# Patient Record
Sex: Female | Born: 1990 | Race: White | Hispanic: No | Marital: Single | Smoking: Current every day smoker
Health system: Southern US, Community
[De-identification: ages and names within clinical notes are randomized; demographics above are authoritative.]

## PROBLEM LIST (undated history)

## (undated) DIAGNOSIS — G43909 Migraine, unspecified, not intractable, without status migrainosus: Secondary | ICD-10-CM

## (undated) DIAGNOSIS — R519 Headache, unspecified: Secondary | ICD-10-CM

## (undated) DIAGNOSIS — F419 Anxiety disorder, unspecified: Secondary | ICD-10-CM

## (undated) DIAGNOSIS — F909 Attention-deficit hyperactivity disorder, unspecified type: Secondary | ICD-10-CM

## (undated) DIAGNOSIS — F329 Major depressive disorder, single episode, unspecified: Secondary | ICD-10-CM

## (undated) DIAGNOSIS — T7840XA Allergy, unspecified, initial encounter: Secondary | ICD-10-CM

## (undated) DIAGNOSIS — F32A Depression, unspecified: Secondary | ICD-10-CM

## (undated) DIAGNOSIS — M419 Scoliosis, unspecified: Secondary | ICD-10-CM

## (undated) DIAGNOSIS — R51 Headache: Secondary | ICD-10-CM

## (undated) HISTORY — DX: Anxiety disorder, unspecified: F41.9

## (undated) HISTORY — DX: Headache, unspecified: R51.9

## (undated) HISTORY — DX: Scoliosis, unspecified: M41.9

## (undated) HISTORY — DX: Depression, unspecified: F32.A

## (undated) HISTORY — DX: Allergy, unspecified, initial encounter: T78.40XA

## (undated) HISTORY — DX: Migraine, unspecified, not intractable, without status migrainosus: G43.909

## (undated) HISTORY — DX: Headache: R51

## (undated) HISTORY — DX: Attention-deficit hyperactivity disorder, unspecified type: F90.9

## (undated) HISTORY — DX: Major depressive disorder, single episode, unspecified: F32.9

---
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
## 2011-02-03 ENCOUNTER — Other Ambulatory Visit: Payer: Self-pay | Admitting: Internal Medicine

## 2011-02-03 DIAGNOSIS — R519 Headache, unspecified: Secondary | ICD-10-CM

## 2011-02-17 ENCOUNTER — Ambulatory Visit
Admission: RE | Admit: 2011-02-17 | Discharge: 2011-02-17 | Disposition: A | Discharge status: Home / Self Care | Payer: BC Managed Care – PPO | Source: Ambulatory Visit | Attending: Internal Medicine | Admitting: Internal Medicine

## 2011-02-17 DIAGNOSIS — R519 Headache, unspecified: Secondary | ICD-10-CM

## 2011-03-07 ENCOUNTER — Ambulatory Visit (INDEPENDENT_AMBULATORY_CARE_PROVIDER_SITE_OTHER): Payer: BC Managed Care – PPO

## 2011-03-07 DIAGNOSIS — H9209 Otalgia, unspecified ear: Secondary | ICD-10-CM

## 2011-03-07 DIAGNOSIS — J029 Acute pharyngitis, unspecified: Secondary | ICD-10-CM

## 2011-03-07 DIAGNOSIS — H6500 Acute serous otitis media, unspecified ear: Secondary | ICD-10-CM

## 2011-08-31 ENCOUNTER — Ambulatory Visit (INDEPENDENT_AMBULATORY_CARE_PROVIDER_SITE_OTHER): Payer: BC Managed Care – PPO | Admitting: Internal Medicine

## 2011-08-31 ENCOUNTER — Encounter: Payer: Self-pay | Admitting: Internal Medicine

## 2011-08-31 ENCOUNTER — Ambulatory Visit: Payer: BC Managed Care – PPO | Admitting: Internal Medicine

## 2011-08-31 VITALS — BP 125/81 | HR 100 | Temp 97.2°F | Resp 16 | Ht 64.0 in | Wt 119.0 lb

## 2011-08-31 DIAGNOSIS — F419 Anxiety disorder, unspecified: Secondary | ICD-10-CM | POA: Insufficient documentation

## 2011-08-31 DIAGNOSIS — F329 Major depressive disorder, single episode, unspecified: Secondary | ICD-10-CM

## 2011-08-31 DIAGNOSIS — F411 Generalized anxiety disorder: Secondary | ICD-10-CM

## 2011-08-31 DIAGNOSIS — F32A Depression, unspecified: Secondary | ICD-10-CM | POA: Insufficient documentation

## 2011-08-31 DIAGNOSIS — F988 Other specified behavioral and emotional disorders with onset usually occurring in childhood and adolescence: Secondary | ICD-10-CM | POA: Insufficient documentation

## 2011-08-31 DIAGNOSIS — F39 Unspecified mood [affective] disorder: Secondary | ICD-10-CM | POA: Insufficient documentation

## 2011-08-31 MED ORDER — AMPHETAMINE-DEXTROAMPHETAMINE 10 MG PO TABS: 10.0000 mg | ORAL_TABLET | Freq: Two times a day (BID) | ORAL | Status: DC

## 2011-08-31 NOTE — Patient Instructions (Addendum)
Risperidone= half a tablet at bedtime for 3 days then discontinue Lamictal= Lamotrigine--100 mg daily for 7 days, then 50 mg daily for 7 days, then 50 mg every other day for 4 doses, then discontinue Wait for one month before starting to discontinue bupropion/he may take 150 mg every other day for 3 doses and then discontinue

## 2011-08-31 NOTE — Progress Notes (Signed)
  Subjective:    Patient ID: Ariel Peters, female    DOB: 02/21/1991, 21 y.o.   MRN: [id number]  HPI Patient Active Problem List  Diagnosis  . ADD (attention deficit disorder)  . Depression  . Anxiety  . Mood disorder  At her last office visit with me she was referred to Dr. Len Blalock for evaluation because of rapid mood changes. She had been on medication for depression and anxiety for a few years. She also was on Adderall for attention deficit disorder. Current outpatient prescriptions :ALPRAZolam (XANAX) 0.5 MG tablet, Take 0.5 mg by mouth at bedtime as needed   buPROPion (ZYBAN) 150 MG 12 hr tablet, Take 150 mg by mouth 2 (two) times daily   lamoTRIgine (LAMICTAL) 150 MG tablet, Take 150 mg by mouth daily  (RISPERDAL) 3 MG tablet, Take 3 mg by mouth 2 (two) times daily This last medication was added by Anne Fu She feels like her is much more stable at this point and that the diagnosis of bipolar disorder was made in error and she would like to discontinue medications She just ended her relationship because of incompatibility She continues to have significant social anxiety and has insomnia Due to worrying 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 and would like to restart Adderall as she has problems with distractibility, efficiency was studying, and procrastination as well as general inattention     Review of Systems No vision changes/no weight loss or night sweats/palpitations only during great anxiety/no chest pain No gait abnormalities/no headaches/describes occasional tremor/no acid  reflux/no abdominal pain No GU or GYN symptoms/menses regular    Objective:   Physical Exam Vital signs stable Pupils equal round and reactive to light and accommodation/EOMs conjugate ENT clear No thyromegaly or lymphadenopathy Heart regular without murmurs rubs or gallops Abdomen benign Neurological is intact/No tremor Psychiatric-affect is appropriate/mood is good/judgment is intact       Assessment & Plan:  Problem #1 ADD Problem #2 social anxiety disorder Problem #3 history of depression now stable Problem #4 history of diagnosis of bipolar disorder  Patient Instructions  Risperidone= half a tablet at bedtime for 3 days then discontinue Lamictal= Lamotrigine--100 mg daily for 7 days, then 50 mg daily for 7 days, then 50 mg every other day for 4 doses, then discontinue Wait for one month before starting to discontinue bupropion/he may take 150 mg every other day for 3 doses and then discontinue  She will continue the Xanax and has a supply/she is she is advised to have close followup as any symptoms emerge/lab testing should be considered if fatigue continues, dizziness continues, or nausea continues We may have to focus on insomnia this does not resolve with discontinuing her medicines Adderall will be restarted at 10 mg twice a day/3 month supply Recheck in 3 months if not sooner

## 2011-09-12 ENCOUNTER — Telehealth: Payer: Self-pay

## 2011-09-12 NOTE — Telephone Encounter (Signed)
Called [facility] [employer] and completed prior auth over the phone and received approval through 09/11/12. Faxed approval notification to pharm. Notified pt.

## 2011-09-12 NOTE — Telephone Encounter (Signed)
Pt calling to say that dr Merla Riches gave her the rx for her adderall, but [facility]. [facility]. [facility] [address]

## 2011-09-12 NOTE — Telephone Encounter (Signed)
Ariel Peters needs a prior authorization is needed for this rx for adderall

## 2011-09-26 ENCOUNTER — Other Ambulatory Visit: Payer: Self-pay | Admitting: Internal Medicine

## 2011-09-27 ENCOUNTER — Other Ambulatory Visit: Payer: Self-pay | Admitting: *Deleted

## 2011-09-27 MED ORDER — ALPRAZOLAM 0.5 MG PO TABS: 0.5000 mg | ORAL_TABLET | Freq: Three times a day (TID) | ORAL | Status: DC | PRN

## 2011-11-10 ENCOUNTER — Other Ambulatory Visit: Payer: Self-pay | Admitting: Family Medicine

## 2011-11-10 MED ORDER — ALPRAZOLAM 0.5 MG PO TABS: 0.5000 mg | ORAL_TABLET | Freq: Three times a day (TID) | ORAL | Status: DC | PRN

## 2011-11-30 ENCOUNTER — Ambulatory Visit: Payer: BC Managed Care – PPO | Admitting: Internal Medicine

## 2011-12-06 ENCOUNTER — Telehealth: Payer: Self-pay

## 2011-12-06 DIAGNOSIS — F988 Other specified behavioral and emotional disorders with onset usually occurring in childhood and adolescence: Secondary | ICD-10-CM

## 2011-12-06 NOTE — Telephone Encounter (Signed)
Dr Merla Riches, pt was in to see you in June and you wanted to see her back in 3 mos or less. Do you want to RF her Adderall until she can get in to see you?

## 2011-12-06 NOTE — Telephone Encounter (Signed)
Pt mother calling they pt came in to office to see Merla Riches but was not able to stay she is asking for a rx refill on pt adderall until they can get an appt with Merla Riches. [phone]

## 2011-12-06 NOTE — Telephone Encounter (Signed)
I told her to come in as a walk-in at [facility] Tommorrow at noon

## 2011-12-07 NOTE — Telephone Encounter (Signed)
She did not come in to [facility] today Okay to give one-month supply of Adderall and she can see me during the next month at the walk-in clinic or at [facility]/ her choice

## 2011-12-08 MED ORDER — AMPHETAMINE-DEXTROAMPHETAMINE 10 MG PO TABS: 10.0000 mg | ORAL_TABLET | Freq: Two times a day (BID) | ORAL | Status: DC

## 2011-12-08 NOTE — Telephone Encounter (Signed)
Refill done and printed

## 2011-12-08 NOTE — Telephone Encounter (Signed)
Notified mother Rx is ready for p/up

## 2011-12-08 NOTE — Telephone Encounter (Signed)
Can we print/sign pt's Rx for Adderall per Dr Doolittle's OK? Left RF pending in message.

## 2011-12-26 ENCOUNTER — Other Ambulatory Visit: Payer: Self-pay | Admitting: Physician Assistant

## 2012-01-04 ENCOUNTER — Encounter: Payer: Self-pay | Admitting: Internal Medicine

## 2012-01-04 ENCOUNTER — Ambulatory Visit (INDEPENDENT_AMBULATORY_CARE_PROVIDER_SITE_OTHER): Payer: BC Managed Care – PPO | Admitting: Internal Medicine

## 2012-01-04 VITALS — BP 100/64 | HR 99 | Temp 98.1°F | Resp 16 | Ht 64.75 in | Wt 111.2 lb

## 2012-01-04 DIAGNOSIS — F411 Generalized anxiety disorder: Secondary | ICD-10-CM

## 2012-01-04 DIAGNOSIS — F419 Anxiety disorder, unspecified: Secondary | ICD-10-CM

## 2012-01-04 DIAGNOSIS — F988 Other specified behavioral and emotional disorders with onset usually occurring in childhood and adolescence: Secondary | ICD-10-CM

## 2012-01-04 MED ORDER — ALPRAZOLAM 0.5 MG PO TABS: 0.5000 mg | ORAL_TABLET | Freq: Three times a day (TID) | ORAL | Status: DC | PRN

## 2012-01-04 MED ORDER — AMPHETAMINE-DEXTROAMPHETAMINE 20 MG PO TABS: 20.0000 mg | ORAL_TABLET | Freq: Two times a day (BID) | ORAL | Status: DC

## 2012-01-04 NOTE — Progress Notes (Signed)
  Subjective:    Patient ID: Ariel Peters, female    DOB: 1990-11-14, 21 y.o.   MRN: [id number]  HPIf/u  Patient Active Problem List  Diagnosis  . ADD (attention deficit disorder)  . 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 ADD (attention deficit disorder)-incr to 20mg  BID  . Anxiety   Meds ordered this encounter  Medications  . amphetamine-dextroamphetamine (ADDERALL) 20 MG tablet    Sig: Take 1 tablet (20 mg total) by mouth 2 (two) times daily.    Dispense:  60 tablet    Refill:  0  . amphetamine-dextroamphetamine (ADDERALL) 20 MG tablet    Sig: Take 1 tablet (20 mg total) by mouth 2 (two) times daily.    Dispense:  60 tablet    Refill:  0  . amphetamine-dextroamphetamine (ADDERALL) 20 MG tablet    Sig: Take 1 tablet (20 mg total) by mouth 2 (two) times daily.    Dispense:  60 tablet    Refill:  0  . ALPRAZolam (XANAX) 0.5 MG tablet    Sig: Take 1 tablet (0.5 mg total) by mouth 3 (three) times daily as needed for anxiety.    Dispense:  90 tablet    Refill:  1  no flu vacc may call in 3 mos if stable

## 2012-04-03 ENCOUNTER — Ambulatory Visit (INDEPENDENT_AMBULATORY_CARE_PROVIDER_SITE_OTHER): Payer: BC Managed Care – PPO | Admitting: Internal Medicine

## 2012-04-03 ENCOUNTER — Encounter: Payer: Self-pay | Admitting: Internal Medicine

## 2012-04-03 VITALS — BP 106/72 | HR 93 | Temp 98.6°F | Resp 16 | Ht 64.0 in | Wt 111.4 lb

## 2012-04-03 DIAGNOSIS — F988 Other specified behavioral and emotional disorders with onset usually occurring in childhood and adolescence: Secondary | ICD-10-CM

## 2012-04-03 DIAGNOSIS — F411 Generalized anxiety disorder: Secondary | ICD-10-CM

## 2012-04-03 DIAGNOSIS — F419 Anxiety disorder, unspecified: Secondary | ICD-10-CM

## 2012-04-03 MED ORDER — AMPHETAMINE-DEXTROAMPHETAMINE 20 MG PO TABS: 20.0000 mg | ORAL_TABLET | Freq: Two times a day (BID) | ORAL | Status: DC

## 2012-04-03 MED ORDER — ALPRAZOLAM 0.5 MG PO TABS: 0.5000 mg | ORAL_TABLET | Freq: Three times a day (TID) | ORAL | Status: DC | PRN

## 2012-04-03 MED ORDER — NORGESTIMATE-ETH ESTRADIOL 0.25-35 MG-MCG PO TABS: 1.0000 | ORAL_TABLET | Freq: Every day | ORAL | Status: DC

## 2012-04-03 NOTE — Progress Notes (Signed)
  Subjective:    Patient ID: Ariel Peters, female    DOB: 12-Aug-1990, 22 y.o.   MRN: [id number]  HPI followup for ADD and anxiety Doing very well. 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 Has decided not to go back to school at this point but to continue working. Adderall works well with no side effects. Anxiety has been very stable requiring Xanax perhaps once a day when stressed by work.  Denies depression, self injury, mania, uncontrolled mood.  Still in relationship- for one year. Good relationship. Condoms. Would like to start birth control mainly because of bad cramps for 2 days that required her to miss work. No dyspareunia. No vaginal discharge. No history of STDs. Partner recently checked for STDs and was negative. She declines testing today.   New guitar she found at [facility]   Review of Systems No new symptoms No headaches or vision changes No chest pain palpitations No genitourinary symptoms Philemon Kingdom has been since menarche    Objective:   Physical Exam Vital signs stable HEENT clear Heart regular Neurological intact Mood good/affect appropriate/judgment intact       Assessment & Plan:   1. ADD (attention deficit disorder)   2. Anxiety   3.   Dysmenorrhea 4.  Contraception  Meds ordered this encounter  Medications  . amphetamine-dextroamphetamine (ADDERALL) 20 MG tablet    Sig: Take 1 tablet (20 mg total) by mouth 2 (two) times daily.    Dispense:  60 tablet    Refill:  0  . amphetamine-dextroamphetamine (ADDERALL) 20 MG tablet    Sig: Take 1 tablet (20 mg total) by mouth 2 (two) times daily.    Dispense:  60 tablet    Refill:  0        05/04/12  . amphetamine-dextroamphetamine (ADDERALL) 20 MG tablet    Sig: Take 1 tablet (20 mg total) by mouth 2 (two) times daily.    Dispense:  60 tablet    Refill:  0      06/01/12  . ALPRAZolam (XANAX) 0.5 MG tablet    Sig: Take 1 tablet (0.5 mg total) by mouth 3 (three) times daily as needed for  anxiety.    Dispense:  90 tablet    Refill:  1  . norgestimate-ethinyl estradiol (ORTHO-CYCLEN,SPRINTEC,PREVIFEM) 0.25-35 MG-MCG tablet    Sig: Take 1 tablet by mouth daily.    Dispense:  1 Package    Refill:  11  Call with problems with birth control/side effects 2 start on first day next menses  may call in 3 months for Adderall and followup in 6 months

## 2012-05-03 ENCOUNTER — Other Ambulatory Visit: Payer: Self-pay | Admitting: Internal Medicine

## 2012-05-04 NOTE — Telephone Encounter (Signed)
Gave 90 w/ 1 refill at last ov 04/03/12

## 2012-05-07 NOTE — Telephone Encounter (Signed)
Left message on machine for patient to call back.

## 2012-05-28 ENCOUNTER — Telehealth: Payer: Self-pay

## 2012-05-28 DIAGNOSIS — F411 Generalized anxiety disorder: Secondary | ICD-10-CM

## 2012-05-28 NOTE — Telephone Encounter (Signed)
Pharm requests RF of alprazolam 0.5 mg.

## 2012-05-29 MED ORDER — ALPRAZOLAM 0.5 MG PO TABS: 0.5000 mg | ORAL_TABLET | Freq: Three times a day (TID) | ORAL | Status: DC | PRN

## 2012-05-29 NOTE — Telephone Encounter (Signed)
No orders of the defined types were placed in this encounter.   Meds ordered this encounter  Medications  . ALPRAZolam (XANAX) 0.5 MG tablet    Sig: Take 1 tablet (0.5 mg total) by mouth 3 (three) times daily as needed for anxiety.    Dispense:  90 tablet    Refill:  1

## 2012-05-29 NOTE — Telephone Encounter (Signed)
[facility] [address]

## 2012-07-09 ENCOUNTER — Telehealth: Payer: Self-pay

## 2012-07-09 DIAGNOSIS — F988 Other specified behavioral and emotional disorders with onset usually occurring in childhood and adolescence: Secondary | ICD-10-CM

## 2012-07-09 NOTE — Telephone Encounter (Signed)
PT WOULD REFILL REQUEST FOR ADDERALL 20 MG. PT STATES THAT SHE IS CURRENTLY OUT. [phone]

## 2012-07-11 MED ORDER — AMPHETAMINE-DEXTROAMPHETAMINE 20 MG PO TABS: 20.0000 mg | ORAL_TABLET | Freq: Two times a day (BID) | ORAL | Status: DC

## 2012-07-11 NOTE — Telephone Encounter (Signed)
Meds ordered this encounter  Medications  . amphetamine-dextroamphetamine (ADDERALL) 20 MG tablet    Sig: Take 1 tablet (20 mg total) by mouth 2 (two) times daily. 09/09/12    Dispense:  60 tablet    Refill:  0  . amphetamine-dextroamphetamine (ADDERALL) 20 MG tablet    Sig: Take 1 tablet (20 mg total) by mouth 2 (two) times daily. 08/10/12    Dispense:  60 tablet    Refill:  0  . amphetamine-dextroamphetamine (ADDERALL) 20 MG tablet    Sig: Take 1 tablet (20 mg total) by mouth 2 (two) times daily.    Dispense:  60 tablet    Refill:  0   appt 3 mos 104

## 2012-07-12 ENCOUNTER — Telehealth: Payer: Self-pay

## 2012-07-12 NOTE — Telephone Encounter (Signed)
Patient advised Rx at front desk for pick up.  

## 2012-07-12 NOTE — Telephone Encounter (Signed)
I left message for her to advise Rx ready for pick up

## 2012-07-12 NOTE — Telephone Encounter (Signed)
Patient is calling us back and asking if we can call her after 600 pm at [phone]

## 2012-09-18 ENCOUNTER — Telehealth: Payer: Self-pay

## 2012-09-18 NOTE — Telephone Encounter (Signed)
Patient would like [place] to call her BCBS insurance to authorize her Adderall. She states that [facility] to do the authorization. Please call her once authorization is done at [phone]. Thanks

## 2012-09-20 NOTE — Telephone Encounter (Signed)
I did not [facility], but called for PA. PA approved for Adderall 20 mg BID through 09/20/12, case #[id number]. Notified pt on VM.

## 2012-10-11 ENCOUNTER — Other Ambulatory Visit: Payer: Self-pay | Admitting: Internal Medicine

## 2012-10-15 ENCOUNTER — Other Ambulatory Visit: Payer: Self-pay | Admitting: Radiology

## 2012-10-23 ENCOUNTER — Other Ambulatory Visit: Payer: Self-pay | Admitting: Internal Medicine

## 2012-11-12 ENCOUNTER — Telehealth: Payer: Self-pay

## 2012-11-12 DIAGNOSIS — F988 Other specified behavioral and emotional disorders with onset usually occurring in childhood and adolescence: Secondary | ICD-10-CM

## 2012-11-12 MED ORDER — AMPHETAMINE-DEXTROAMPHETAMINE 20 MG PO TABS: 20.0000 mg | ORAL_TABLET | Freq: Two times a day (BID) | ORAL | Status: DC

## 2012-11-12 NOTE — Telephone Encounter (Incomplete)
When does she plan to follow up? She plans to come in on Saturday to see you, your hours are provided, she states weekends are best for her. Pended 46month supply until we can see her.

## 2012-11-12 NOTE — Telephone Encounter (Signed)
Ok and tell her I will not be here this weekend but will be here the last weekend of this month

## 2012-11-12 NOTE — Telephone Encounter (Signed)
PT IN NEED OF HER ADDERALL. PLEASE CALL [phone] WHEN READY FOR PICK UP

## 2012-11-13 NOTE — Telephone Encounter (Signed)
lmom that rx is ready for pickup.  

## 2012-11-20 ENCOUNTER — Other Ambulatory Visit: Payer: Self-pay | Admitting: Physician Assistant

## 2012-11-22 ENCOUNTER — Other Ambulatory Visit: Payer: Self-pay | Admitting: Radiology

## 2012-12-12 ENCOUNTER — Ambulatory Visit (INDEPENDENT_AMBULATORY_CARE_PROVIDER_SITE_OTHER): Payer: BC Managed Care – PPO | Admitting: Internal Medicine

## 2012-12-12 ENCOUNTER — Encounter: Payer: Self-pay | Admitting: Internal Medicine

## 2012-12-12 VITALS — BP 130/80 | HR 87 | Temp 98.6°F | Resp 16 | Ht 64.75 in | Wt 112.8 lb

## 2012-12-12 DIAGNOSIS — F411 Generalized anxiety disorder: Secondary | ICD-10-CM

## 2012-12-12 DIAGNOSIS — F419 Anxiety disorder, unspecified: Secondary | ICD-10-CM

## 2012-12-12 DIAGNOSIS — F988 Other specified behavioral and emotional disorders with onset usually occurring in childhood and adolescence: Secondary | ICD-10-CM

## 2012-12-12 MED ORDER — AMPHETAMINE-DEXTROAMPHETAMINE 20 MG PO TABS: 20.0000 mg | ORAL_TABLET | Freq: Two times a day (BID) | ORAL | Status: DC

## 2012-12-12 NOTE — Progress Notes (Signed)
  Subjective:    Patient ID: Ariel Peters, female    DOB: 1990-08-26, 22 y.o.   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 Adderall working well for her. Takes twice a day. Takes early enough in day to not interfere with bedtime. Had a panic attack the other night while sleeping. Does not have these regularly. Has to take Alanson Aly most days to go out. Recent 2 mos ago messy bkup w/ BF---getting over it now Bought a harp a few months ago and has been teaching self to play. Also plays guitar and piano. Walks for exercise- finds this helps her mood. OCP working well to control menstrual symptoms.  New bf in [place]  Review of Systems noncontr    Objective:   Physical Exam  Constitutional: She is oriented to person, place, and time. She appears well-developed and well-nourished.  Neurological: She is alert and oriented to person, place, and time.  Psychiatric: She has a normal mood and affect. Her behavior is normal. Judgment and thought content normal.      Assessment & Plan:  ADD (attention deficit disorder) - Plan: amphetamine-dextroamphetamine (ADDERALL) 20 MG tablet, amphetamine-dextroamphetamine (ADDERALL) 20 MG tablet, amphetamine-dextroamphetamine (ADDERALL) 20 MG tablet  Anxiety  May call for xanax or ocps and adderall and f/u in 6 mos   dg/rpd

## 2013-01-13 ENCOUNTER — Telehealth: Payer: Self-pay

## 2013-01-13 MED ORDER — ALPRAZOLAM 0.5 MG PO TABS: ORAL_TABLET | ORAL | Status: DC

## 2013-01-13 NOTE — Telephone Encounter (Signed)
[facility] Called pt to let her know.

## 2013-01-13 NOTE — Telephone Encounter (Signed)
No orders of the defined types were placed in this encounter.   Meds ordered this encounter  Medications  . ALPRAZolam (XANAX) 0.5 MG tablet    Sig: TAKE 1 TABLET 3 TIMES A DAY AS NEEDED FOR ANXIETY    Dispense:  90 tablet    Refill:  0

## 2013-01-13 NOTE — Telephone Encounter (Signed)
Pt is needing a refill on zanax and would like it called in to [facility]

## 2013-03-11 ENCOUNTER — Other Ambulatory Visit: Payer: Self-pay | Admitting: Internal Medicine

## 2013-03-12 NOTE — Telephone Encounter (Signed)
Meds ordered this encounter  Medications  . ALPRAZolam (XANAX) 0.5 MG tablet    Sig: TAKE 1 TABLET 3 TIMES A DAY AS NEEDED FOR ANXIETY    Dispense:  90 tablet    Refill:  0

## 2013-03-12 NOTE — Telephone Encounter (Signed)
Pt here in October. She is to follow up in April. Pt aware.Ok to refill Xanax?

## 2013-03-12 NOTE — Telephone Encounter (Signed)
Pt made aware

## 2013-03-12 NOTE — Telephone Encounter (Signed)
[facility] 

## 2013-03-18 ENCOUNTER — Other Ambulatory Visit: Payer: Self-pay | Admitting: Internal Medicine

## 2013-03-21 ENCOUNTER — Telehealth: Payer: Self-pay

## 2013-03-21 DIAGNOSIS — F988 Other specified behavioral and emotional disorders with onset usually occurring in childhood and adolescence: Secondary | ICD-10-CM

## 2013-03-21 MED ORDER — AMPHETAMINE-DEXTROAMPHETAMINE 20 MG PO TABS: 20.0000 mg | ORAL_TABLET | Freq: Two times a day (BID) | ORAL | Status: DC

## 2013-03-21 NOTE — Telephone Encounter (Signed)
Ready

## 2013-03-21 NOTE — Telephone Encounter (Signed)
Patient requesting refill on her adderall medication [phone]

## 2013-03-22 NOTE — Telephone Encounter (Signed)
Spoke with pt advised Rx ready for pick up 

## 2013-04-13 ENCOUNTER — Other Ambulatory Visit: Payer: Self-pay | Admitting: Internal Medicine

## 2013-04-16 NOTE — Telephone Encounter (Signed)
Faxed

## 2013-04-26 ENCOUNTER — Telehealth: Payer: Self-pay

## 2013-04-26 DIAGNOSIS — F988 Other specified behavioral and emotional disorders with onset usually occurring in childhood and adolescence: Secondary | ICD-10-CM

## 2013-04-26 MED ORDER — AMPHETAMINE-DEXTROAMPHETAMINE 20 MG PO TABS
20.0000 mg | ORAL_TABLET | Freq: Two times a day (BID) | ORAL | Status: DC
Start: 2013-04-26 — End: 2013-07-17

## 2013-04-26 MED ORDER — AMPHETAMINE-DEXTROAMPHETAMINE 20 MG PO TABS: 20.0000 mg | ORAL_TABLET | Freq: Two times a day (BID) | ORAL | Status: DC

## 2013-04-26 NOTE — Telephone Encounter (Signed)
Meds ordered this encounter  Medications  . amphetamine-dextroamphetamine (ADDERALL) 20 MG tablet    Sig: Take 1 tablet (20 mg total) by mouth 2 (two) times daily. Fill 30 d after signing    Dispense:  60 tablet    Refill:  0  . amphetamine-dextroamphetamine (ADDERALL) 20 MG tablet    Sig: Take 1 tablet (20 mg total) by mouth 2 (two) times daily.    Dispense:  60 tablet    Refill:  0   F/u after these

## 2013-04-26 NOTE — Telephone Encounter (Signed)
Pt would like a refill on adderall 20 mg.

## 2013-04-27 ENCOUNTER — Telehealth: Payer: Self-pay | Admitting: Radiology

## 2013-04-27 NOTE — Telephone Encounter (Signed)
Left message for patient to pick up medication

## 2013-04-27 NOTE — Telephone Encounter (Signed)
Called patient about script being ready

## 2013-04-27 NOTE — Telephone Encounter (Signed)
Left message for patient that her script is ready to be picked up.

## 2013-05-23 ENCOUNTER — Telehealth: Payer: Self-pay

## 2013-05-23 NOTE — Telephone Encounter (Signed)
Pt called states she needs her Xanax refilled.She also wanted to know if she could do auto refill. [facility] [address]. She would someone to call her as soon as it is sent to the pharmacy.. She can be reached at [phone]. Thank you

## 2013-05-23 NOTE — Telephone Encounter (Signed)
Pt called and stated she needs a refill for her xanax. She also wanted to know if it could be auto refilled. [facility] [address]. She can be reached at [phone]. Thank you

## 2013-05-24 MED ORDER — ALPRAZOLAM 0.5 MG PO TABS
ORAL_TABLET | ORAL | Status: DC
Start: 2013-05-24 — End: 2013-07-17

## 2013-05-24 NOTE — Telephone Encounter (Signed)
Ok to rf xanax

## 2013-05-24 NOTE — Telephone Encounter (Signed)
Medication has been called into her pharmacy 

## 2013-05-27 NOTE — Telephone Encounter (Signed)
LMOM to notify RF had been sent if pt was not already aware.

## 2013-06-29 ENCOUNTER — Other Ambulatory Visit: Payer: Self-pay | Admitting: Internal Medicine

## 2013-06-29 ENCOUNTER — Telehealth: Payer: Self-pay

## 2013-06-29 NOTE — Telephone Encounter (Signed)
PATIENT IS REQUESTING HER PRESCRIPTION BE WRITTEN FOR ADDERALL 20 MG. PLEASE CALL HER WHEN IT IS READY TO BE PICKED UP.  BEST PHONE [phone] (CELL)   MBC

## 2013-06-29 NOTE — Telephone Encounter (Signed)
Looks like pt is due for f/u. Ok to fill?

## 2013-06-30 NOTE — Telephone Encounter (Signed)
Time for f/u OV --last ov 12/2012

## 2013-07-01 NOTE — Telephone Encounter (Signed)
LMVM to CB. 

## 2013-07-01 NOTE — Telephone Encounter (Signed)
Spoke to patient.  Advised her to RTC for refill.  Patient said she would do so.

## 2013-07-17 ENCOUNTER — Encounter: Payer: Self-pay | Admitting: Internal Medicine

## 2013-07-17 ENCOUNTER — Ambulatory Visit (INDEPENDENT_AMBULATORY_CARE_PROVIDER_SITE_OTHER): Payer: BC Managed Care – PPO | Admitting: Internal Medicine

## 2013-07-17 VITALS — BP 120/70 | HR 92 | Temp 99.7°F | Resp 16 | Wt 111.8 lb

## 2013-07-17 DIAGNOSIS — F419 Anxiety disorder, unspecified: Secondary | ICD-10-CM

## 2013-07-17 DIAGNOSIS — F988 Other specified behavioral and emotional disorders with onset usually occurring in childhood and adolescence: Secondary | ICD-10-CM

## 2013-07-17 DIAGNOSIS — F411 Generalized anxiety disorder: Secondary | ICD-10-CM

## 2013-07-17 MED ORDER — AMPHETAMINE-DEXTROAMPHETAMINE 20 MG PO TABS: 20.0000 mg | ORAL_TABLET | Freq: Two times a day (BID) | ORAL | Status: DC

## 2013-07-17 MED ORDER — ALPRAZOLAM 0.5 MG PO TABS: ORAL_TABLET | ORAL | Status: DC

## 2013-07-17 NOTE — Patient Instructions (Signed)
PA-C Tamela OddiJo Hughes Dr Tora DuckJason Jones Dr Phillip HealJane Steiner  All at [phone]  Dr Geannie RisenBuck Braden [phone] At [facility]

## 2013-07-17 NOTE — Progress Notes (Signed)
Subjective:  This chart was scribed for Ellamae Siaobert Ahmoni Edge, MD by Elveria Risingimelie Horne, Medial Scribe. This patient was seen in room 25 and the patient's care was started at 4:28 PM.    Patient ID: Ariel Peters, female    DOB: 01/02/1991, 23 y.o.   MRN: [id number]  HPI HPI Comments: Ariel Peters is a 23 y.o. female who presents to the [facility] for an overdue 6 month follow up after an Adderall and Xanax prescription.  Patient shares a desire to be referred to another psychiatrist who is not so intimidating and who will be able to specifically help her with Anxiety.  Patient desires medication for her Anxiety. States that she has tried different medications for her Depression, but is unsure if she has ever taken anything specifically for Anxiety. She states that the medications that she has previously taken for Depression did not alleviate her Anxiety. Patient shares that both Adderall and Xanax seem to give her enough to go in public.  Patient shares that her Anxiety is it's worst before going out to a public place. States the anxiety likely dates back to middle school and drastically worsened in college. Patient says that her Anxiety detrimentally affected her classes and her success while there.  Patient reports says she does like to go out, but she prefers going out at night when so many people aren't out.  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 Shares that her unemployment contributes to her Anxiety. Patient denies trouble sleeping due to her Anxiety.    Patient reports that since her last visit here she has quite smoking, which she seems to very proud of.   Patient Active Problem List   Diagnosis Date Noted  . ADD (attention deficit disorder) 08/31/2011  . Anxiety 08/31/2011   No past medical history on file. No past surgical history on file. No Known Allergies Prior to Admission medications   Medication Sig Start Date End Date Taking?  Authorizing Provider  ALPRAZolam (XANAX) 0.5 MG tablet TAKE 1 TABLET BY MOUTH 3 TIMES A DAY AS NEEDED FOR ANXIETY 05/24/13   Tonye Pearsonobert P Trenisha Lafavor, MD  amphetamine-dextroamphetamine (ADDERALL) 20 MG tablet Take 1 tablet (20 mg total) by mouth 2 (two) times daily. 03/21/13   Morrell RiddleSarah L Weber, PA-C  amphetamine-dextroamphetamine (ADDERALL) 20 MG tablet Take 1 tablet (20 mg total) by mouth 2 (two) times daily. Fill 30 d after signing 04/26/13   Tonye Pearsonobert P Sean Macwilliams, MD  amphetamine-dextroamphetamine (ADDERALL) 20 MG tablet Take 1 tablet (20 mg total) by mouth 2 (two) times daily. 04/26/13   Tonye Pearsonobert P Alyas Creary, MD  norgestimate-ethinyl estradiol (ORTHO-CYCLEN,SPRINTEC,PREVIFEM) 0.25-35 MG-MCG tablet TAKE 1 TABLET BY MOUTH EVERY DAY 03/18/13   Godfrey PickEleanore E Egan, PA-C   History    Social History Main Topics  . Smoking status: Current Some Day Smoker  . Smokeless tobacco: Not on file  . Alcohol Use: Not on file  . Drug Use: Not on file  . Sexual Activity: Not on file      Review of Systems Noncontributory.      Objective:   Physical Exam  Nursing note and vitals reviewed. Constitutional: She is oriented to person, place, and time. She appears well-developed and well-nourished. No distress.  HENT:  Head: Normocephalic and atraumatic.  Eyes: EOM are normal.  Neck: Neck supple.  Cardiovascular: Normal rate.   Pulmonary/Chest: Effort normal. No respiratory distress.  Musculoskeletal: Normal range of motion.  Neurological: She is alert and  oriented to person, place, and time.  Skin: Skin is warm and dry.  Psychiatric: She has a normal mood and affect. Her behavior is normal.          Assessment & Plan:  I have completed the patient encounter in its entirety as documented by the scribe, with editing by me where necessary. Lisanne Ponce P. Merla Richesoolittle, M.D. ADD (attention deficit disorder) - Plan: amphetamine-dextroamphetamine (ADDERALL) 20 MG tablet, amphetamine-dextroamphetamine (ADDERALL) 20 MG tablet,  amphetamine-dextroamphetamine (ADDERALL) 20 MG tablet  Anxiety -alpraz ref  Meds ordered this encounter  Medications  . amphetamine-dextroamphetamine (ADDERALL) 20 MG tablet    Sig: Take 1 tablet (20 mg total) by mouth 2 (two) times daily. For 60 days after signed    Dispense:  60 tablet    Refill:  0  . amphetamine-dextroamphetamine (ADDERALL) 20 MG tablet    Sig: Take 1 tablet (20 mg total) by mouth 2 (two) times daily. Fill 30 d after signing    Dispense:  60 tablet    Refill:  0  . amphetamine-dextroamphetamine (ADDERALL) 20 MG tablet    Sig: Take 1 tablet (20 mg total) by mouth 2 (two) times daily.    Dispense:  60 tablet    Refill:  0    Order Specific Question:  Supervising Provider    Answer:  Hakiem Malizia P [[id number]]  . ALPRAZolam (XANAX) 0.5 MG tablet    Sig: TAKE 1 TABLET BY MOUTH 3 TIMES A DAY AS NEEDED FOR ANXIETY    Dispense:  90 tablet    Refill:  2   Patient Instructions  PA-C Tamela OddiJo Hughes Dr Tora DuckJason Jones Dr Phillip HealJane Steiner  All at [phone]  Dr Geannie RisenBuck Braden [phone] At [facility]

## 2013-10-16 ENCOUNTER — Other Ambulatory Visit: Payer: Self-pay | Admitting: Internal Medicine

## 2013-10-16 DIAGNOSIS — F988 Other specified behavioral and emotional disorders with onset usually occurring in childhood and adolescence: Secondary | ICD-10-CM

## 2013-10-16 MED ORDER — AMPHETAMINE-DEXTROAMPHETAMINE 20 MG PO TABS
20.0000 mg | ORAL_TABLET | Freq: Two times a day (BID) | ORAL | Status: DC
Start: 2013-10-16 — End: 2014-06-25

## 2013-10-16 MED ORDER — AMPHETAMINE-DEXTROAMPHETAMINE 20 MG PO TABS: 20.0000 mg | ORAL_TABLET | Freq: Two times a day (BID) | ORAL | Status: DC

## 2013-10-16 NOTE — Telephone Encounter (Signed)
Pt notified that rx's are ready for p/u

## 2013-10-16 NOTE — Telephone Encounter (Signed)
Patient calling to get a rx refill on her adderall rx 20mg . Please call when ready . Thank you!.   [phone]

## 2013-10-16 NOTE — Telephone Encounter (Signed)
Meds ordered this encounter  Medications  . ALPRAZolam (XANAX) 0.5 MG tablet    Sig: TAKE 1 TABLET BY MOUTH 3 TIMES A DAY AS NEEDED FOR ANXIETY.    Dispense:  90 tablet    Refill:  1    Not to exceed 4 additional fills before 01/13/2014  . amphetamine-dextroamphetamine (ADDERALL) 20 MG tablet    Sig: Take 1 tablet (20 mg total) by mouth 2 (two) times daily.    Dispense:  60 tablet    Refill:  0  . amphetamine-dextroamphetamine (ADDERALL) 20 MG tablet    Sig: Take 1 tablet (20 mg total) by mouth 2 (two) times daily. Fill 30 d after signing    Dispense:  60 tablet    Refill:  0  . amphetamine-dextroamphetamine (ADDERALL) 20 MG tablet    Sig: Take 1 tablet (20 mg total) by mouth 2 (two) times daily. For 60 days after signed    Dispense:  60 tablet    Refill:  0

## 2013-10-16 NOTE — Addendum Note (Signed)
Addended by: Tonye Pearson on: 10/16/2013 06:48 PM   Modules accepted: Orders

## 2013-10-18 ENCOUNTER — Telehealth: Payer: Self-pay

## 2013-10-18 NOTE — Telephone Encounter (Signed)
PA needed for adderall 20 mg BID. Completed on phone and approved through 10/18/14 case # [id number]. Notified pharm.

## 2014-03-18 ENCOUNTER — Telehealth: Payer: Self-pay

## 2014-03-18 NOTE — Telephone Encounter (Signed)
Spoke to pt, she is aware med has been refilled.

## 2014-03-18 NOTE — Telephone Encounter (Signed)
Patient calling about her birth control refill. [facility] [address]  [phone]

## 2014-03-21 MED ORDER — NORGESTIMATE-ETH ESTRADIOL 0.25-35 MG-MCG PO TABS: 1.0000 | ORAL_TABLET | Freq: Every day | ORAL | Status: DC

## 2014-03-21 NOTE — Addendum Note (Signed)
Addended by: Johnnette LitterARDWELL, Brexley Cutshaw M on: 03/21/2014 02:05 PM   Modules accepted: Orders

## 2014-03-21 NOTE — Telephone Encounter (Signed)
Pt stopped by and said her med was not at the pharm. Does not appear that it was refilled. Pt has not ever seen us for contraception. Advised that I would send in a month but that she would have to RTC for more. Pt agreed.

## 2014-05-07 ENCOUNTER — Ambulatory Visit (INDEPENDENT_AMBULATORY_CARE_PROVIDER_SITE_OTHER): Payer: BC Managed Care – PPO | Admitting: Physician Assistant

## 2014-05-07 VITALS — BP 120/76 | HR 76 | Temp 97.5°F | Resp 16 | Ht 64.5 in | Wt 106.6 lb

## 2014-05-07 DIAGNOSIS — J302 Other seasonal allergic rhinitis: Secondary | ICD-10-CM | POA: Diagnosis not present

## 2014-05-07 DIAGNOSIS — Z760 Encounter for issue of repeat prescription: Secondary | ICD-10-CM | POA: Diagnosis not present

## 2014-05-07 DIAGNOSIS — J069 Acute upper respiratory infection, unspecified: Secondary | ICD-10-CM | POA: Diagnosis not present

## 2014-05-07 DIAGNOSIS — B9789 Other viral agents as the cause of diseases classified elsewhere: Principal | ICD-10-CM

## 2014-05-07 MED ORDER — CETIRIZINE HCL 10 MG PO TABS: 10.0000 mg | ORAL_TABLET | Freq: Every day | ORAL | Status: DC

## 2014-05-07 MED ORDER — NAPROXEN 500 MG PO TABS: 500.0000 mg | ORAL_TABLET | Freq: Two times a day (BID) | ORAL | Status: DC

## 2014-05-07 MED ORDER — NORGESTIMATE-ETH ESTRADIOL 0.25-35 MG-MCG PO TABS: 1.0000 | ORAL_TABLET | Freq: Every day | ORAL | Status: DC

## 2014-05-07 MED ORDER — FLUTICASONE PROPIONATE 50 MCG/ACT NA SUSP: 2.0000 | Freq: Every day | NASAL | Status: DC

## 2014-05-07 NOTE — Addendum Note (Signed)
Addended by: Ofilia NeasLARK, Milagro Belmares L on: 05/07/2014 04:48 PM   Modules accepted: Orders, SmartSet

## 2014-05-07 NOTE — Patient Instructions (Addendum)
Consider getting a humidifier.  Drink lots of fluid.  Please don't return to work until your symptoms have improved and you have received adequate rest.

## 2014-05-07 NOTE — Progress Notes (Addendum)
05/07/2014 at 2:53 PM  Ariel Peters / DOB: 1990-06-27 / MRN: [id number]  The patient has ADD (attention deficit disorder) and Anxiety on Ariel Peters problem list.  SUBJECTIVE  Chief compalaint: Nasal Congestion; Cough; Sore Throat; and Sinusitis   History of present illness: Ariel Peters is 24 y.o. well appearing female presenting for severe sinus congestion, mild sore throat, bilateral ear pressure. This started 3 days ago and is worsening . Associated symptoms include myalgia, fatigue, and sinus HA.  She denies SOB, chest pain, mucopurulence, changes in appetite, nausea and fever. She has tried Mucinex with good to fair relief. She works and Government social research officer and has had sick contacts through [employer].  She has a history of anxiety, well controlled now with medication. She reports a history of allergies, and takes one benadryl if Ariel Peters symptoms get bad.  She has not tried taking any for this illness.     She  has no past medical history on file.    She  has a current medication list which includes the following prescription(s): alprazolam, amphetamine-dextroamphetamine, amphetamine-dextroamphetamine, amphetamine-dextroamphetamine, citalopram, and norgestimate-ethinyl estradiol.  Ariel Peters has No Known Allergies. She  reports that she has been smoking Cigarettes.  She has a 1 pack-year smoking history. She does not have any smokeless tobacco history on file. She reports that she does not use illicit drugs. She  has no sexual activity history on file. The patient  has no past surgical history on file.  Ariel Peters family history is not on file.  ROS  Per HPI  OBJECTIVE  Ariel Peters  height is 5' 4.5" (1.638 m) and weight is 106 lb 9.6 oz (48.353 kg). Ariel Peters oral temperature is 97.5 F (36.4 C). Ariel Peters blood pressure is 120/76 and Ariel Peters pulse is 76. Ariel Peters respiration is 16.  The patient's body mass index is 18.02 kg/(m^2).  Physical Exam  Constitutional: She is oriented to person, place, and time. She appears  well-developed and well-nourished. No distress.  HENT:  Right Ear: Hearing, tympanic membrane, external ear and ear canal normal.  Left Ear: Hearing, tympanic membrane, external ear and ear canal normal.  Nose: Mucosal edema present. Right sinus exhibits no maxillary sinus tenderness and no frontal sinus tenderness. Left sinus exhibits no maxillary sinus tenderness and no frontal sinus tenderness.  Mouth/Throat: Uvula is midline, oropharynx is clear and moist and mucous membranes are normal.  Cardiovascular: Normal rate, regular rhythm and normal heart sounds.   Respiratory: Effort normal and breath sounds normal. She has no wheezes. She has no rales.  Neurological: She is alert and oriented to person, place, and time.  Skin: Skin is warm and dry. She is not diaphoretic.  Psychiatric: She has a normal mood and affect.    No results found for this or any previous visit (from the past 24 hour(s)).  ASSESSMENT & PLAN  Ariel Peters was seen today for nasal congestion, cough, sore throat and sinusitis.  Diagnoses and all orders for this visit:  Viral URI with cough: Work note provided. Orders: -     cetirizine (ZYRTEC) 10 MG tablet; Take 1 tablet (10 mg total) by mouth daily. -     naproxen (NAPROSYN) 500 MG tablet; Take 1 tablet (500 mg total) by mouth 2 (two) times daily with a meal.  Seasonal allergies Orders: -     cetirizine (ZYRTEC) 10 MG tablet; Take 1 tablet (10 mg total) by mouth daily. -     fluticasone (FLONASE) 50 MCG/ACT nasal spray; Place 2 sprays into both  nostrils daily. Take two sprays in each nostril daily.  Medication refill       -     Ortho cyclen, refilling per previous appointment with Dr. Merla Richesoolittle.  Patient is a smoker, but is under age 24, and there is no record of hypertension of migraine HA with aura in Ariel Peters chart.     The patient was advised to call or come back to clinic if she does not see an improvement in symptoms, or worsens with the above plan.   Deliah BostonMichael  Clark, MHS, PA-C [facility] 05/07/2014 2:53 PM

## 2014-05-09 ENCOUNTER — Telehealth: Payer: Self-pay

## 2014-05-09 NOTE — Telephone Encounter (Signed)
Left message for pt to call back  °

## 2014-05-09 NOTE — Telephone Encounter (Signed)
Pt was called multiple times on 3/2. We have a rx for her BCP, but she needs to provide a ua sample for HcG test before we can give it to her. LMOM to CB again.

## 2014-05-09 NOTE — Telephone Encounter (Signed)
Rx in the nurses box

## 2014-05-13 NOTE — Telephone Encounter (Signed)
LMOM to CB. 

## 2014-05-22 ENCOUNTER — Telehealth: Payer: Self-pay

## 2014-05-22 NOTE — Telephone Encounter (Signed)
Dr. Merla Richesoolittle, Pt is calling for a refill of adderall. [phone]

## 2014-05-26 NOTE — Telephone Encounter (Signed)
More than 6 mos so needs f/u

## 2014-05-27 NOTE — Telephone Encounter (Signed)
Left message for pt to RTC for additional refills.

## 2014-06-06 ENCOUNTER — Telehealth: Payer: Self-pay

## 2014-06-06 DIAGNOSIS — F988 Other specified behavioral and emotional disorders with onset usually occurring in childhood and adolescence: Secondary | ICD-10-CM

## 2014-06-06 MED ORDER — AMPHETAMINE-DEXTROAMPHETAMINE 20 MG PO TABS: 20.0000 mg | ORAL_TABLET | Freq: Two times a day (BID) | ORAL | Status: DC

## 2014-06-06 NOTE — Telephone Encounter (Signed)
PATIENT HAS AN APPOINTMENT TO SEE DR. DOOLITTLE ON April 20 TH. SHE WOULD LIKE TO KNOW IF DR. DOOLITTLE WOULD WRITE HER PRESCRIPTION FOR ADDERALL 20 MG BEFORE THEN? BEST PHONE [phone] (CELL)  MBC

## 2014-06-06 NOTE — Telephone Encounter (Signed)
printed

## 2014-06-06 NOTE — Telephone Encounter (Signed)
To Dr Doolittle to review  

## 2014-06-07 NOTE — Telephone Encounter (Signed)
lmom that rx ready for p/u. 

## 2014-06-09 ENCOUNTER — Telehealth: Payer: Self-pay

## 2014-06-09 NOTE — Telephone Encounter (Signed)
Pt has Rx's in the nurses box with a note. If patient calls back ask her to give a urine sample while she is here to pick up Adderall Rx.

## 2014-06-10 ENCOUNTER — Other Ambulatory Visit (INDEPENDENT_AMBULATORY_CARE_PROVIDER_SITE_OTHER): Payer: BC Managed Care – PPO

## 2014-06-10 ENCOUNTER — Other Ambulatory Visit: Payer: Self-pay | Admitting: Radiology

## 2014-06-10 DIAGNOSIS — Z3200 Encounter for pregnancy test, result unknown: Secondary | ICD-10-CM | POA: Diagnosis not present

## 2014-06-10 LAB — POCT URINE PREGNANCY: Preg Test, Ur: NEGATIVE

## 2014-06-10 NOTE — Telephone Encounter (Signed)
lmom to cb (to inform pt that she needs to provide urine for HCG). I will go ahead and order this in Epic.

## 2014-06-10 NOTE — Telephone Encounter (Signed)
Informed pt she will need HCG. Pt understood.

## 2014-06-25 ENCOUNTER — Encounter: Payer: Self-pay | Admitting: Internal Medicine

## 2014-06-25 ENCOUNTER — Ambulatory Visit (INDEPENDENT_AMBULATORY_CARE_PROVIDER_SITE_OTHER): Payer: BC Managed Care – PPO | Admitting: Internal Medicine

## 2014-06-25 VITALS — BP 125/80 | HR 96 | Temp 98.6°F | Resp 16 | Ht 64.5 in | Wt 110.0 lb

## 2014-06-25 DIAGNOSIS — F909 Attention-deficit hyperactivity disorder, unspecified type: Secondary | ICD-10-CM

## 2014-06-25 DIAGNOSIS — F988 Other specified behavioral and emotional disorders with onset usually occurring in childhood and adolescence: Secondary | ICD-10-CM

## 2014-06-25 DIAGNOSIS — F419 Anxiety disorder, unspecified: Secondary | ICD-10-CM

## 2014-06-25 MED ORDER — AMPHETAMINE-DEXTROAMPHETAMINE 20 MG PO TABS: 20.0000 mg | ORAL_TABLET | Freq: Two times a day (BID) | ORAL | Status: DC

## 2014-06-25 MED ORDER — CITALOPRAM HYDROBROMIDE 20 MG PO TABS: 20.0000 mg | ORAL_TABLET | Freq: Every day | ORAL | Status: DC

## 2014-06-25 MED ORDER — ALPRAZOLAM 0.5 MG PO TABS: ORAL_TABLET | ORAL | Status: DC

## 2014-06-26 NOTE — Progress Notes (Signed)
Fu Patient Active Problem List   Diagnosis Date Noted  . ADD (attention deficit disorder) 08/31/2011  . Anxiety 08/31/2011   She is doing extremely well. Celexa has stabilized her I anxiety problems She has a job that she enjoys She still has time for her music/singing Attended Recent concert with her father-r giddens Adderall still is very helpful for work and for any academic activities Needs occasional Xanax for excessive anxiety or panic but this is getting to be more more rare Stable relationship for greater than 1 year/living with him/  Contraception/no problems  Prior to Admission medications   Medication Sig Start Date End Date Taking? Authorizing Provider  ALPRAZolam (XANAX) 0.5 MG tablet TAKE 1 TABLET BY MOUTH 3 TIMES A DAY AS NEEDED FOR ANXIETY. 06/25/14  Yes Tonye Pearsonobert P Charli Halle, MD  amphetamine-dextroamphetamine (ADDERALL) 20 MG tablet Take 1 tablet (20 mg total) by mouth 2 (two) times daily. 06/25/14  Yes Tonye Pearsonobert P Retaj Hilbun, MD  amphetamine-dextroamphetamine (ADDERALL) 20 MG tablet Take 1 tablet (20 mg total) by mouth 2 (two) times daily. For 60 days after signed 06/25/14  Yes Tonye Pearsonobert P Deondrea Aguado, MD  amphetamine-dextroamphetamine (ADDERALL) 20 MG tablet Take 1 tablet (20 mg total) by mouth 2 (two) times daily. Fill 30 d after signing 06/25/14  Yes Tonye Pearsonobert P Martavious Hartel, MD  cetirizine (ZYRTEC) 10 MG tablet Take 1 tablet (10 mg total) by mouth daily. 05/07/14  Yes Ofilia NeasMichael L Clark, PA-C  citalopram (CELEXA) 20 MG tablet Take 1 tablet (20 mg total) by mouth daily. 06/25/14  Yes Tonye Pearsonobert P Kojo Liby, MD  norgestimate-ethinyl estradiol (ORTHO-CYCLEN,SPRINTEC,PREVIFEM) 0.25-35 MG-MCG tablet Take 1 tablet by mouth daily. 05/07/14  Yes Ofilia NeasMichael L Clark, PA-C   Her exam is stable BP 125/80 mmHg  Pulse 96  Temp(Src) 98.6 F (37 C)  Resp 16  Ht 5' 4.5" (1.638 m)  Wt 110 lb (49.896 kg)  BMI 18.60 kg/m2  SpO2 99%   Impression ADD (attention deficit disorder) - Plan:  amphetamine-dextroamphetamine (ADDERALL) 20 MG tablet, amphetamine-dextroamphetamine (ADDERALL) 20 MG tablet, amphetamine-dextroamphetamine (ADDERALL) 20 MG tablet  Anxiety  Meds ordered this encounter  Medications  . amphetamine-dextroamphetamine (ADDERALL) 20 MG tablet    Sig: Take 1 tablet (20 mg total) by mouth 2 (two) times daily.    Dispense:  60 tablet    Refill:  0  . amphetamine-dextroamphetamine (ADDERALL) 20 MG tablet    Sig: Take 1 tablet (20 mg total) by mouth 2 (two) times daily. For 60 days after signed    Dispense:  60 tablet    Refill:  0  . amphetamine-dextroamphetamine (ADDERALL) 20 MG tablet    Sig: Take 1 tablet (20 mg total) by mouth 2 (two) times daily. Fill 30 d after signing    Dispense:  60 tablet    Refill:  0  . ALPRAZolam (XANAX) 0.5 MG tablet    Sig: TAKE 1 TABLET BY MOUTH 3 TIMES A DAY AS NEEDED FOR ANXIETY.    Dispense:  90 tablet    Refill:  5  . citalopram (CELEXA) 20 MG tablet    Sig: Take 1 tablet (20 mg total) by mouth daily.    Dispense:  90 tablet    Refill:  3   Call in 3 months and follow-up in 6 months

## 2014-10-12 ENCOUNTER — Other Ambulatory Visit: Payer: Self-pay | Admitting: Physician Assistant

## 2014-10-12 NOTE — Telephone Encounter (Signed)
Patient request a refill on Adderall 20 MG. Please call patient when prescription is ready [phone]

## 2014-10-16 ENCOUNTER — Encounter: Payer: Self-pay | Admitting: Family Medicine

## 2014-10-16 ENCOUNTER — Ambulatory Visit (HOSPITAL_BASED_OUTPATIENT_CLINIC_OR_DEPARTMENT_OTHER)
Admission: RE | Admit: 2014-10-16 | Discharge: 2014-10-16 | Disposition: A | Discharge status: Home / Self Care | Payer: BC Managed Care – PPO | Source: Ambulatory Visit | Attending: Family Medicine | Admitting: Family Medicine

## 2014-10-16 ENCOUNTER — Ambulatory Visit (INDEPENDENT_AMBULATORY_CARE_PROVIDER_SITE_OTHER): Payer: BC Managed Care – PPO | Admitting: Family Medicine

## 2014-10-16 VITALS — BP 131/89 | HR 93 | Temp 98.0°F | Resp 16 | Ht 64.5 in | Wt 112.0 lb

## 2014-10-16 DIAGNOSIS — R109 Unspecified abdominal pain: Secondary | ICD-10-CM | POA: Diagnosis not present

## 2014-10-16 DIAGNOSIS — R0989 Other specified symptoms and signs involving the circulatory and respiratory systems: Secondary | ICD-10-CM | POA: Insufficient documentation

## 2014-10-16 DIAGNOSIS — R1013 Epigastric pain: Secondary | ICD-10-CM

## 2014-10-16 MED ORDER — PANTOPRAZOLE SODIUM 40 MG PO TBEC: 40.0000 mg | DELAYED_RELEASE_TABLET | Freq: Every day | ORAL | Status: DC

## 2014-10-16 MED ORDER — RANITIDINE HCL 300 MG PO TABS: 300.0000 mg | ORAL_TABLET | Freq: Every day | ORAL | Status: DC

## 2014-10-16 NOTE — Progress Notes (Signed)
Office Note 10/16/2014  CC:  Chief Complaint  Patient presents with  . Establish Care   HPI:  Ariel Peters is a 24 y.o. female who is here to establish care and discuss abd pain. Patient's most [facility] primary [facility]: [facility] on [address] in [place]. [facility]: she saw them briefly and says her antidepressant has helped so she saw no reason to return to them.  Her adderall has historically been RF'd/managed by [facility]. Old records in EPIC/HL EMR were reviewed prior to or during today's visit.  About 4 days of constant ache/soreness in mid upper abdomen, loss of appetite, some nausea on/off with vomiting some 2 d/a (non-bilious).  No fever.  Feels like it goes through to her back.  No radiation to lower abd.  Took ibup 2 tabs yesterday for HA then decided no further doses of this should be taken.  No NSAIDs prior to onset of her pain. Pain is worse with deep inspiration.  No cough.  No CP.  Denies this kind of pain before.  No BM in about a week. Usual frequency is q2-3 d.  No hx of hard bm's or difficulty passing BM's.    No urinary frequency, burning, or urgency.  No vag d/c.    No OTC antacids/acid blockers have been tried.  Past Medical History  Diagnosis Date  . Depression   . Frequent headaches   . Allergy   . Migraines   . Adult ADHD   . Scoliosis     Past Surgical History  Procedure Laterality Date  . Tonsillectomy and adenoidectomy Bilateral 2005  . Wisdom tooth extraction  2011    Family History  Problem Relation Age of Onset  . Hypertension Father     Social History   Social History  . Marital Status: Single    Spouse Name: N/A  . Number of Children: N/A  . Years of Education: N/A   Occupational History  . Not on file.   Social History Main Topics  . Smoking status: Current Every Day Smoker -- 0.25 packs/day for 4 years    Types: Cigarettes  . Smokeless tobacco: Never Used  . Alcohol Use: 0.0  oz/week    0 Standard drinks or equivalent per week     Comment: occasionally  . Drug Use: No  . Sexual Activity: Not on file   Other Topics Concern  . Not on file   Social History Narrative   Single, no children.   Educ: HS   Occupation: works at [employer].  Occasional.      Outpatient Encounter Prescriptions as of 10/16/2014  Medication Sig  . ALPRAZolam (XANAX) 0.5 MG tablet TAKE 1 TABLET BY MOUTH 3 TIMES A DAY AS NEEDED FOR ANXIETY.  Marland Kitchen amphetamine-dextroamphetamine (ADDERALL) 20 MG tablet Take 1 tablet (20 mg total) by mouth 2 (two) times daily.  . cetirizine (ZYRTEC) 10 MG tablet TAKE 1 TABLET (10 MG TOTAL) BY MOUTH DAILY.  . citalopram (CELEXA) 20 MG tablet Take 1 tablet (20 mg total) by mouth daily.  . norgestimate-ethinyl estradiol (ORTHO-CYCLEN,SPRINTEC,PREVIFEM) 0.25-35 MG-MCG tablet Take 1 tablet by mouth daily.  . pantoprazole (PROTONIX) 40 MG tablet Take 1 tablet (40 mg total) by mouth daily.  . ranitidine (ZANTAC) 300 MG tablet Take 1 tablet (300 mg total) by mouth at bedtime.  . [DISCONTINUED] amphetamine-dextroamphetamine (ADDERALL) 20 MG tablet Take 1 tablet (20 mg total) by mouth 2 (two) times daily. For 60  days after signed (Patient not taking: Reported on 10/16/2014)  . [DISCONTINUED] amphetamine-dextroamphetamine (ADDERALL) 20 MG tablet Take 1 tablet (20 mg total) by mouth 2 (two) times daily. Fill 30 d after signing (Patient not taking: Reported on 10/16/2014)   No facility-administered encounter medications on file as of 10/16/2014.    No Known Allergies  ROS Review of Systems  Constitutional: Positive for fatigue. Negative for fever.  HENT: Negative for congestion and sore throat.   Eyes: Negative for visual disturbance.  Respiratory: Negative for cough.   Cardiovascular: Negative for chest pain.  Genitourinary: Negative for dysuria.  Musculoskeletal: Negative for myalgias and joint swelling.  Skin: Negative for rash.  Neurological: Positive for  headaches. Negative for weakness.  Hematological: Negative for adenopathy.    PE; Blood pressure 131/89, pulse 93, temperature 98 F (36.7 C), temperature source Oral, resp. rate 16, height 5' 4.5" (1.638 m), weight 112 lb (50.803 kg), last menstrual period 10/02/2014, SpO2 99 %. LMP 10/02/14 Gen: alert, oriented x 4.  Appears tired and mildly anxious.   Doesn't make good eye contact. VHQ:IONG: no injection, icteris, swelling, or exudate.  EOMI, PERRLA. Mouth: lips without lesion/swelling.  Oral mucosa pink and moist. Oropharynx without erythema, exudate, or swelling.  Neck - No masses or thyromegaly or limitation in range of motion CV: RRR, no m/r/g.   LUNGS: CTA bilat, nonlabored resps, good aeration in all lung fields. ABD: soft, nondistended.  Significant tenderness to palpation in mid-epigastric region down to umbilicus.  Abd aortic pulsations palpable, + abdominal bruit in midline epigastric region.  Mild LUQ TTP and RUQ TTP.  No lower abd tenderness.  Femoral pulses are faint. EXT: no clubbing, cyanosis, or edema.  Back: scoliotic deformity noted SKIN: no rash or jaundice  Pertinent labs:  None today (pt was unable to give a urine sample for urine preg test)  ASSESSMENT AND PLAN:   Epigastric pain, with an abdominal bruit on exam. Her most likely diagnosis is gastritis/PUD and this is what I will start treatment for  (pantoprazole 40mg  qAM and zantac 300 mg qhs).  However, need to r/o AAA even in this young patient so I have ordered an abdominal ultrasound to be done today.  Also check CBC, CMET, lipase, and H pylori IgG ab. If u/s neg for AAA, I suppose the bruit could come from turbulence in aortic flow as a result of her scoliosis deformity (?).  An After Visit Summary was printed and given to the patient.  Return in about 1 week (around 10/23/2014) for f/u abd pain.

## 2014-10-16 NOTE — Progress Notes (Signed)
Pre visit review using our clinic review tool, if applicable. No additional management support is needed unless otherwise documented below in the visit note. 

## 2014-10-17 ENCOUNTER — Telehealth: Payer: Self-pay

## 2014-10-17 DIAGNOSIS — F988 Other specified behavioral and emotional disorders with onset usually occurring in childhood and adolescence: Secondary | ICD-10-CM

## 2014-10-17 LAB — CBC WITH DIFFERENTIAL/PLATELET
Basophils Absolute: 0 10*3/uL (ref 0.0–0.1)
Basophils Relative: 0.5 % (ref 0.0–3.0)
Eosinophils Absolute: 0 10*3/uL (ref 0.0–0.7)
Eosinophils Relative: 0.4 % (ref 0.0–5.0)
HCT: 45.9 % (ref 36.0–46.0)
Hemoglobin: 15.3 g/dL — ABNORMAL HIGH (ref 12.0–15.0)
Lymphocytes Relative: 21.9 % (ref 12.0–46.0)
Lymphs Abs: 1.2 10*3/uL (ref 0.7–4.0)
MCHC: 33.3 g/dL (ref 30.0–36.0)
MCV: 91.5 fl (ref 78.0–100.0)
Monocytes Absolute: 0.3 10*3/uL (ref 0.1–1.0)
Monocytes Relative: 4.6 % (ref 3.0–12.0)
Neutro Abs: 3.9 10*3/uL (ref 1.4–7.7)
Neutrophils Relative %: 72.6 % (ref 43.0–77.0)
Platelets: 149 10*3/uL — ABNORMAL LOW (ref 150.0–400.0)
RBC: 5.02 Mil/uL (ref 3.87–5.11)
RDW: 14.6 % (ref 11.5–15.5)
WBC: 5.4 10*3/uL (ref 4.0–10.5)

## 2014-10-17 LAB — COMPREHENSIVE METABOLIC PANEL
ALT: 18 U/L (ref 0–35)
AST: 30 U/L (ref 0–37)
Albumin: 4.1 g/dL (ref 3.5–5.2)
Alkaline Phosphatase: 55 U/L (ref 39–117)
BUN: 13 mg/dL (ref 6–23)
CO2: 27 mEq/L (ref 19–32)
Calcium: 9.3 mg/dL (ref 8.4–10.5)
Chloride: 102 mEq/L (ref 96–112)
Creatinine, Ser: 0.83 mg/dL (ref 0.40–1.20)
GFR: 90.2 mL/min (ref >60.00)
Glucose, Bld: 79 mg/dL (ref 70–99)
Potassium: 3.7 mEq/L (ref 3.5–5.1)
Sodium: 138 mEq/L (ref 135–145)
Total Bilirubin: 0.4 mg/dL (ref 0.2–1.2)
Total Protein: 6.7 g/dL (ref 6.0–8.3)

## 2014-10-17 LAB — LIPASE: Lipase: 6 U/L — ABNORMAL LOW (ref 11.0–59.0)

## 2014-10-17 LAB — H. PYLORI ANTIBODY, IGG: H Pylori IgG: NEGATIVE

## 2014-10-17 MED ORDER — AMPHETAMINE-DEXTROAMPHETAMINE 20 MG PO TABS: 20.0000 mg | ORAL_TABLET | Freq: Two times a day (BID) | ORAL | Status: DC

## 2014-10-17 NOTE — Telephone Encounter (Signed)
Meds ordered this encounter  Medications  . amphetamine-dextroamphetamine (ADDERALL) 20 MG tablet    Sig: Take 1 tablet (20 mg total) by mouth 2 (two) times daily.    Dispense:  60 tablet    Refill:  0  . amphetamine-dextroamphetamine (ADDERALL) 20 MG tablet    Sig: Take 1 tablet (20 mg total) by mouth 2 (two) times daily. For 30d after signed    Dispense:  60 tablet    Refill:  0    

## 2014-10-17 NOTE — Telephone Encounter (Signed)
Rx in pick up draw. 

## 2014-10-17 NOTE — Telephone Encounter (Signed)
Patient called last week to get a refill on Adderrall and the messasge wasn't recorded. She states that this happens every time she request a refill.

## 2014-10-18 NOTE — Telephone Encounter (Signed)
Pt notified that rx is ready for pick up

## 2014-10-20 ENCOUNTER — Telehealth: Payer: Self-pay

## 2014-10-20 NOTE — Telephone Encounter (Signed)
PA approved through 10/20/15, case # [id number]. Notified pt.

## 2014-10-20 NOTE — Telephone Encounter (Signed)
Patient needs PA for her Adderrall. She states she needs her meds today.   [phone]

## 2014-12-22 ENCOUNTER — Other Ambulatory Visit: Payer: Self-pay

## 2014-12-22 DIAGNOSIS — F988 Other specified behavioral and emotional disorders with onset usually occurring in childhood and adolescence: Secondary | ICD-10-CM

## 2014-12-22 MED ORDER — AMPHETAMINE-DEXTROAMPHETAMINE 20 MG PO TABS: 20.0000 mg | ORAL_TABLET | Freq: Two times a day (BID) | ORAL | Status: DC

## 2014-12-22 NOTE — Telephone Encounter (Signed)
Meds ordered this encounter  Medications  . amphetamine-dextroamphetamine (ADDERALL) 20 MG tablet    Sig: Take 1 tablet (20 mg total) by mouth 2 (two) times daily.    Dispense:  60 tablet    Refill:  0   F/u before abother ref

## 2014-12-22 NOTE — Telephone Encounter (Signed)
Spoke with pt, she would like one more Rx and she will plan to see you in the walk-in clinic next week. I gave her times where you would be at [facility] walk-in.

## 2014-12-22 NOTE — Telephone Encounter (Signed)
Time for 6mo f/u 

## 2014-12-22 NOTE — Telephone Encounter (Signed)
Pt requesting adderall  Refill   Best phone for pt is [phone]

## 2014-12-23 NOTE — Telephone Encounter (Signed)
Notified pt ready, and that Dr Merla Richesoolittle did advise that she needs to keep plan to RTC.

## 2014-12-23 NOTE — Telephone Encounter (Signed)
Called pt. And left a VM,advise rx was ready for pick up.

## 2015-01-25 ENCOUNTER — Ambulatory Visit (INDEPENDENT_AMBULATORY_CARE_PROVIDER_SITE_OTHER): Payer: BC Managed Care – PPO | Admitting: Internal Medicine

## 2015-01-25 VITALS — BP 112/74 | HR 88 | Temp 98.1°F | Resp 16 | Ht 64.0 in | Wt 121.0 lb

## 2015-01-25 DIAGNOSIS — F909 Attention-deficit hyperactivity disorder, unspecified type: Secondary | ICD-10-CM | POA: Diagnosis not present

## 2015-01-25 DIAGNOSIS — F988 Other specified behavioral and emotional disorders with onset usually occurring in childhood and adolescence: Secondary | ICD-10-CM

## 2015-01-25 MED ORDER — ALPRAZOLAM 0.5 MG PO TABS: 0.5000 mg | ORAL_TABLET | Freq: Two times a day (BID) | ORAL | Status: DC | PRN

## 2015-01-25 MED ORDER — AMPHETAMINE-DEXTROAMPHETAMINE 20 MG PO TABS: 20.0000 mg | ORAL_TABLET | Freq: Two times a day (BID) | ORAL | Status: DC

## 2015-01-25 NOTE — Progress Notes (Signed)
Subjective:  This chart was scribed for [name], MD by [facility] LowNadim Abu Hashem, medical scribe at [facility].The patient was seen in exam room 10 and the patient's care was started at 3:20 PM.   Patient ID: Ariel Peters, female    DOB: 01/02/1991, 24 y.o.   MRN: [id number] Chief Complaint  Patient presents with  . med check    pt. come back to see how the meds are doing   HPI HPI Comments: Ariel Peters is a 24 y.o. female who presents to [facility] for a medication refill. Now Works at [employer], she feels this job has improved her confidence and mood greatly. She feels the adderall is needed for her job. No side effects on the medication.  On Celexa, her mood is great. Out of xanax for about a month has not felt anxious but would like a refill on this.  Relationship is good.  She declines flu shot today. 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 ADD (attention deficit disorder) 08/31/2011  . Anxiety 08/31/2011    Past Medical History  Diagnosis Date  . Depression   . Frequent headaches   . Allergy   . Migraines   . Adult ADHD   . Scoliosis   . Anxiety    Prior to Admission medications   Medication Sig Start Date End Date Taking? Authorizing Provider  amphetamine-dextroamphetamine (ADDERALL) 20 MG tablet Take 1 tablet (20 mg total) by mouth 2 (two) times daily. For 30d after signed 10/17/14  Yes Tonye Pearsonobert P Airyonna Franklyn, MD  amphetamine-dextroamphetamine (ADDERALL) 20 MG tablet Take 1 tablet (20 mg total) by mouth 2 (two) times daily. 12/22/14  Yes Tonye Pearsonobert P Adanna Zuckerman, MD  cetirizine (ZYRTEC) 10 MG tablet TAKE 1 TABLET (10 MG TOTAL) BY MOUTH DAILY. 10/13/14  Yes Ofilia NeasMichael L Clark, PA-C  citalopram (CELEXA) 20 MG tablet Take 1 tablet (20 mg total) by mouth daily. 06/25/14  Yes Tonye Pearsonobert P Dejane Scheibe, MD  norgestimate-ethinyl estradiol (ORTHO-CYCLEN,SPRINTEC,PREVIFEM) 0.25-35 MG-MCG  tablet Take 1 tablet by mouth daily. 05/07/14  Yes Ofilia NeasMichael L Clark, PA-C  ranitidine (ZANTAC) 300 MG tablet Take 1 tablet (300 mg total) by mouth at bedtime. 10/16/14 off Yes Jeoffrey MassedPhilip H McGowen, MD  ALPRAZolam Prudy Feeler(XANAX) 0.5 MG tablet TAKE 1 TABLET BY MOUTH 3 TIMES A DAY AS NEEDED FOR ANXIETY. Patient not taking: Reported on 01/25/2015 06/25/14   Tonye Pearsonobert P Janny Crute, MD   No Known Allergies  Review of Systems [place]    Objective:  BP 112/74 mmHg  Pulse 88  Temp(Src) 98.1 F (36.7 C) (Oral)  Resp 16  Ht 5\' 4"  (1.626 m)  Wt 121 lb (54.885 kg)  BMI 20.76 kg/m2  SpO2 98%  LMP 01/05/2015 (Approximate) Physical Exam  Constitutional: She is oriented to person, place, and time. She appears well-developed and well-nourished. No distress.  HENT:  Head: Normocephalic and atraumatic.  Eyes: Pupils are equal, round, and reactive to light.  Neck: Normal range of motion.  Cardiovascular: Normal rate and regular rhythm.   Pulmonary/Chest: Effort normal. No respiratory distress.  Musculoskeletal: Normal range of motion.  Neurological: She is alert and oriented to person, place, and time.  Skin: Skin is warm and dry.  Psychiatric: She has a normal mood and affect. Her behavior is normal.  Nursing note and vitals reviewed.     Assessment & Plan:  ADD (attention deficit disorder)  - Plan: amphetamine-dextroamphetamine (  ADDERALL) 20 MG tablet bid  Anxiety -Continue Celexa/when necessary alprazolam  Meds ordered this encounter  Medications  . amphetamine-dextroamphetamine (ADDERALL) 20 MG tablet    Sig: Take 1 tablet (20 mg total) by mouth 2 (two) times daily.    Dispense:  60 tablet    Refill:  0  . amphetamine-dextroamphetamine (ADDERALL) 20 MG tablet    Sig: Take 1 tablet (20 mg total) by mouth 2 (two) times daily. For 30d after signed    Dispense:  60 tablet    Refill:  0  . amphetamine-dextroamphetamine (ADDERALL) 20 MG tablet    Sig: Take 1 tablet (20 mg total) by mouth 2 (two) times daily.  For 60 d after signed    Dispense:  60 tablet    Refill:  0  . ALPRAZolam (XANAX) 0.5 MG tablet    Sig: Take 1 tablet (0.5 mg total) by mouth 2 (two) times daily as needed for anxiety.    Dispense:  30 tablet    Refill:  5   follow-up 6 months/call for refills in 3   I have completed the patient encounter in its entirety as documented by the scribe, with editing by me where necessary. Jasraj Lappe P. Merla Riches, M.D.  By signing my name below, I, Nadim Abuhashem, attest that this documentation has been prepared under the direction and in the presence of Ellamae Sia, MD.  Electronically Signed: Conchita Paris, medical scribe. 01/25/2015, 3:19 PM.

## 2015-01-25 NOTE — Patient Instructions (Signed)
Ariel Peters [facility]

## 2015-02-15 ENCOUNTER — Encounter (HOSPITAL_COMMUNITY): Payer: Self-pay

## 2015-02-15 ENCOUNTER — Emergency Department (HOSPITAL_COMMUNITY): Payer: BC Managed Care – PPO

## 2015-02-15 ENCOUNTER — Emergency Department (HOSPITAL_COMMUNITY)
Admission: EM | Admit: 2015-02-15 | Discharge: 2015-02-15 | Disposition: A | Discharge status: Home / Self Care | Payer: BC Managed Care – PPO | Attending: Emergency Medicine | Admitting: Emergency Medicine

## 2015-02-15 DIAGNOSIS — S0081XA Abrasion of other part of head, initial encounter: Secondary | ICD-10-CM | POA: Insufficient documentation

## 2015-02-15 DIAGNOSIS — M419 Scoliosis, unspecified: Secondary | ICD-10-CM | POA: Diagnosis not present

## 2015-02-15 DIAGNOSIS — S7012XA Contusion of left thigh, initial encounter: Secondary | ICD-10-CM | POA: Insufficient documentation

## 2015-02-15 DIAGNOSIS — Y998 Other external cause status: Secondary | ICD-10-CM | POA: Insufficient documentation

## 2015-02-15 DIAGNOSIS — F419 Anxiety disorder, unspecified: Secondary | ICD-10-CM | POA: Diagnosis not present

## 2015-02-15 DIAGNOSIS — F329 Major depressive disorder, single episode, unspecified: Secondary | ICD-10-CM | POA: Diagnosis not present

## 2015-02-15 DIAGNOSIS — Z79818 Long term (current) use of other agents affecting estrogen receptors and estrogen levels: Secondary | ICD-10-CM | POA: Diagnosis not present

## 2015-02-15 DIAGNOSIS — Z79899 Other long term (current) drug therapy: Secondary | ICD-10-CM | POA: Insufficient documentation

## 2015-02-15 DIAGNOSIS — Z23 Encounter for immunization: Secondary | ICD-10-CM | POA: Diagnosis not present

## 2015-02-15 DIAGNOSIS — Y92410 Unspecified street and highway as the place of occurrence of the external cause: Secondary | ICD-10-CM | POA: Insufficient documentation

## 2015-02-15 DIAGNOSIS — T07XXXA Unspecified multiple injuries, initial encounter: Secondary | ICD-10-CM

## 2015-02-15 DIAGNOSIS — S0031XA Abrasion of nose, initial encounter: Secondary | ICD-10-CM | POA: Insufficient documentation

## 2015-02-15 DIAGNOSIS — F17210 Nicotine dependence, cigarettes, uncomplicated: Secondary | ICD-10-CM | POA: Insufficient documentation

## 2015-02-15 DIAGNOSIS — S61412A Laceration without foreign body of left hand, initial encounter: Secondary | ICD-10-CM | POA: Insufficient documentation

## 2015-02-15 DIAGNOSIS — S79922A Unspecified injury of left thigh, initial encounter: Secondary | ICD-10-CM | POA: Diagnosis present

## 2015-02-15 DIAGNOSIS — Y9389 Activity, other specified: Secondary | ICD-10-CM | POA: Diagnosis not present

## 2015-02-15 DIAGNOSIS — Z8659 Personal history of other mental and behavioral disorders: Secondary | ICD-10-CM | POA: Insufficient documentation

## 2015-02-15 DIAGNOSIS — F909 Attention-deficit hyperactivity disorder, unspecified type: Secondary | ICD-10-CM | POA: Diagnosis not present

## 2015-02-15 MED ORDER — IBUPROFEN 800 MG PO TABS: 800.0000 mg | ORAL_TABLET | Freq: Three times a day (TID) | ORAL | Status: DC

## 2015-02-15 MED ORDER — HYDROCODONE-ACETAMINOPHEN 5-325 MG PO TABS
1.0000 | ORAL_TABLET | Freq: Once | ORAL | Status: AC
  Administered 2015-02-15: 1 via ORAL
  Filled 2015-02-15: qty 1

## 2015-02-15 MED ORDER — CYCLOBENZAPRINE HCL 10 MG PO TABS: 10.0000 mg | ORAL_TABLET | Freq: Two times a day (BID) | ORAL | Status: DC | PRN

## 2015-02-15 MED ORDER — TETANUS-DIPHTH-ACELL PERTUSSIS 5-2.5-18.5 LF-MCG/0.5 IM SUSP
0.5000 mL | Freq: Once | INTRAMUSCULAR | Status: AC
  Administered 2015-02-15: 0.5 mL via INTRAMUSCULAR
  Filled 2015-02-15: qty 0.5
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
## 2015-02-15 NOTE — ED Notes (Signed)
Shari PA at bedside   
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
## 2015-02-15 NOTE — ED Notes (Signed)
Patient transported to X-ray 

## 2015-04-29 ENCOUNTER — Other Ambulatory Visit: Payer: Self-pay

## 2015-04-29 DIAGNOSIS — F988 Other specified behavioral and emotional disorders with onset usually occurring in childhood and adolescence: Secondary | ICD-10-CM

## 2015-04-29 NOTE — Telephone Encounter (Signed)
Pt in need of her ADDERALL 20 MG. Please call [phone] when ready for pick up

## 2015-05-02 MED ORDER — AMPHETAMINE-DEXTROAMPHETAMINE 20 MG PO TABS: 20.0000 mg | ORAL_TABLET | Freq: Two times a day (BID) | ORAL | Status: DC

## 2015-05-04 NOTE — Telephone Encounter (Signed)
Notified pt ready. 

## 2015-05-26 ENCOUNTER — Telehealth: Payer: Self-pay | Admitting: Family Medicine

## 2015-05-26 NOTE — Telephone Encounter (Signed)
Patient declined flu shot per chart

## 2015-05-26 NOTE — Telephone Encounter (Signed)
error 

## 2015-05-28 ENCOUNTER — Other Ambulatory Visit: Payer: Self-pay | Admitting: Physician Assistant

## 2015-05-30 NOTE — Telephone Encounter (Signed)
Needs a follow up 1 month prescription called in

## 2015-07-11 ENCOUNTER — Other Ambulatory Visit: Payer: Self-pay | Admitting: Internal Medicine

## 2015-08-01 ENCOUNTER — Telehealth: Payer: Self-pay

## 2015-08-01 DIAGNOSIS — F988 Other specified behavioral and emotional disorders with onset usually occurring in childhood and adolescence: Secondary | ICD-10-CM

## 2015-08-01 NOTE — Telephone Encounter (Signed)
Patient called and wants adderall refill.

## 2015-08-05 MED ORDER — AMPHETAMINE-DEXTROAMPHETAMINE 20 MG PO TABS: 20.0000 mg | ORAL_TABLET | Freq: Two times a day (BID) | ORAL | Status: DC

## 2015-08-05 NOTE — Telephone Encounter (Signed)
Meds ordered this encounter  Medications  . amphetamine-dextroamphetamine (ADDERALL) 20 MG tablet    Sig: Take 1 tablet (20 mg total) by mouth 2 (two) times daily.    Dispense:  60 tablet    Refill:  0  will need f/u after this---i suggest f/u with steph English for ongoing refills
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
## 2015-09-07 ENCOUNTER — Encounter: Payer: Self-pay | Admitting: Internal Medicine

## 2015-09-07 ENCOUNTER — Ambulatory Visit (INDEPENDENT_AMBULATORY_CARE_PROVIDER_SITE_OTHER): Payer: BLUE CROSS/BLUE SHIELD | Admitting: Internal Medicine

## 2015-09-07 VITALS — BP 118/80 | HR 85 | Temp 98.2°F | Resp 18 | Ht 64.0 in | Wt 121.0 lb

## 2015-09-07 DIAGNOSIS — F988 Other specified behavioral and emotional disorders with onset usually occurring in childhood and adolescence: Secondary | ICD-10-CM

## 2015-09-07 DIAGNOSIS — F909 Attention-deficit hyperactivity disorder, unspecified type: Secondary | ICD-10-CM | POA: Diagnosis not present

## 2015-09-07 DIAGNOSIS — F419 Anxiety disorder, unspecified: Secondary | ICD-10-CM | POA: Diagnosis not present

## 2015-09-07 MED ORDER — AMPHETAMINE-DEXTROAMPHETAMINE 20 MG PO TABS: 20.0000 mg | ORAL_TABLET | Freq: Two times a day (BID) | ORAL | Status: DC

## 2015-09-07 MED ORDER — NORGESTIMATE-ETH ESTRADIOL 0.25-35 MG-MCG PO TABS: 1.0000 | ORAL_TABLET | Freq: Every day | ORAL | Status: DC

## 2015-09-07 MED ORDER — CITALOPRAM HYDROBROMIDE 20 MG PO TABS: ORAL_TABLET | ORAL | Status: DC

## 2015-09-07 NOTE — Patient Instructions (Addendum)
     IF you received an x-ray today, you will receive an invoice from [facility]. Please contact [facility] at [phone] with questions or concerns regarding your invoice.   IF you received labwork today, you will receive an invoice from [facility]. Please contact [facility] at [phone] with questions or concerns regarding your invoice.   Our billing staff will not be able to assist you with questions regarding bills from these companies.  You will be contacted with the lab results as soon as they are available. The fastest way to get your results is to activate your My Chart account. Instructions are located on the last page of this paperwork. If you have not heard from us regarding the results in 2 weeks, please contact this office.     Benny LennertSarah Weber PA-C fo followup when out of meds

## 2015-09-09 NOTE — Progress Notes (Signed)
Chief Complaint  Patient presents with  . 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 She is no longer slowed down by depressed thoughts with anhedonism or indecision and no longer plagued by self-doubt. She describes the onset of her depressed thinking is predating adolescence without contributing factors from her family and she is aware of. She has a great relationship with everyone in her family and no negative messages. She denies any preceding PTSD type of events.  She remains in a committed relationship for over 2 years duration. She would like refill of OCPs. She has never had a Pap smear as has always been fearful this would hurt.  Exam- BP 118/80 mmHg  Pulse 85  Temp(Src) 98.2 F (36.8 C) (Oral)  Resp 18  Ht 5\' 4"  (1.626 m)  Wt 121 lb (54.885 kg)  BMI 20.76 kg/m2  LMP 08/24/2015 Stable as in past  ADD (attention deficit disorder) - Plan: amphetamine-dextroamphetamine (ADDERALL) 20 MG tablet, amphetamine-dextroamphetamine (ADDERALL) 20 MG tablet  Anxiety with depression--- we discussed the likelihood of the need for long-term medications as the origin of her problems is more likely to be genetic than environmental  Contraception--she will return for follow-up and have a Pap smear withS Weber PA-C in 3-6 mos  Meds ordered this encounter  Medications  . citalopram (CELEXA) 20 MG tablet    Sig: TAKE 1 TABLET (20 MG TOTAL) BY MOUTH DAILY.    Dispense:  90 tablet    Refill:  3  . amphetamine-dextroamphetamine (ADDERALL) 20 MG tablet    Sig: Take 1 tablet (20 mg total) by mouth 2 (two) times daily. For 60 d after signed    Dispense:  60 tablet    Refill:  0  . amphetamine-dextroamphetamine (ADDERALL) 20 MG tablet    Sig: Take 1 tablet (20 mg total)  by mouth 2 (two) times daily. For 30d after signed    Dispense:  60 tablet    Refill:  0  . amphetamine-dextroamphetamine (ADDERALL) 20 MG tablet    Sig: Take 1 tablet (20 mg total) by mouth 2 (two) times daily.    Dispense:  60 tablet    Refill:  0  . norgestimate-ethinyl estradiol (SPRINTEC 28) 0.25-35 MG-MCG tablet    Sig: Take 1 tablet by mouth daily.    Dispense:  28 tablet    Refill:  11   F/u [id number] SW

## 2015-12-04 ENCOUNTER — Ambulatory Visit (INDEPENDENT_AMBULATORY_CARE_PROVIDER_SITE_OTHER): Payer: BLUE CROSS/BLUE SHIELD | Admitting: Physician Assistant

## 2015-12-04 DIAGNOSIS — F909 Attention-deficit hyperactivity disorder, unspecified type: Secondary | ICD-10-CM

## 2015-12-04 DIAGNOSIS — F988 Other specified behavioral and emotional disorders with onset usually occurring in childhood and adolescence: Secondary | ICD-10-CM

## 2015-12-04 MED ORDER — NORGESTIMATE-ETH ESTRADIOL 0.25-35 MG-MCG PO TABS: 1.0000 | ORAL_TABLET | Freq: Every day | ORAL | 3 refills | Status: DC

## 2015-12-04 MED ORDER — AMPHETAMINE-DEXTROAMPHETAMINE 20 MG PO TABS: 20.0000 mg | ORAL_TABLET | Freq: Two times a day (BID) | ORAL | 0 refills | Status: DC

## 2015-12-04 NOTE — Progress Notes (Signed)
   Ariel BlowerKelli E Belleau  MRN: [id number] DOB: 09/30/1990  Subjective:  Pt presents to clinic for medication refills.  She is doing really well on her medications - she takes her Adderall daily.  She is really happy with her job.  Her depression is well controlled on Celexa.  Review of Systems  Constitutional: Negative for appetite change, chills, fever and unexpected weight change.  Cardiovascular: Negative for chest pain and palpitations.    Patient Active Problem List   Diagnosis Date Noted  . ADD (attention deficit disorder) 08/31/2011  . Anxiety 08/31/2011    Current Outpatient Prescriptions on File Prior to Visit  Medication Sig Dispense Refill  . citalopram (CELEXA) 20 MG tablet TAKE 1 TABLET (20 MG TOTAL) BY MOUTH DAILY. 90 tablet 3   No current facility-administered medications on file prior to visit.     No Known Allergies  Pt patients past, family and social history were reviewed and updated.  Objective:  BP 118/80 (BP Location: Right Arm, Patient Position: Sitting, Cuff Size: Small)   Pulse 74   Temp 98.2 F (36.8 C) (Oral)   Resp 16   Ht 5\' 5"  (1.651 m)   Wt 122 lb (55.3 kg)   LMP 11/07/2015 (Approximate)   SpO2 100%   BMI 20.30 kg/m   Physical Exam  Constitutional: She is oriented to person, place, and time and well-developed, well-nourished, and in no distress.  HENT:  Head: Normocephalic and atraumatic.  Right Ear: Hearing and external ear normal.  Left Ear: Hearing and external ear normal.  Eyes: Conjunctivae are normal.  Neck: Normal range of motion.  Pulmonary/Chest: Effort normal.  Neurological: She is alert and oriented to person, place, and time. Gait normal.  Skin: Skin is warm and dry.  Psychiatric: Mood, memory, affect and judgment normal.  Vitals reviewed.   Assessment and Plan :  ADD (attention deficit disorder) - Plan: amphetamine-dextroamphetamine (ADDERALL) 20 MG tablet, amphetamine-dextroamphetamine (ADDERALL) 20 MG tablet,  amphetamine-dextroamphetamine (ADDERALL) 20 MG tablet   Will refill in 3months - she will recheck with me in 6 months - pt to determine if she has had Gardasil and think about having a pap smear  Benny LennertSarah Weber PA-C  [facility] 12/04/2015 10:22 AM

## 2015-12-04 NOTE — Patient Instructions (Signed)
     IF you received an x-ray today, you will receive an invoice from [facility]. Please contact [facility] at [phone] with questions or concerns regarding your invoice.   IF you received labwork today, you will receive an invoice from [facility]. Please contact [facility] at [phone] with questions or concerns regarding your invoice.   Our billing staff will not be able to assist you with questions regarding bills from these companies.  You will be contacted with the lab results as soon as they are available. The fastest way to get your results is to activate your My Chart account. Instructions are located on the last page of this paperwork. If you have not heard from us regarding the results in 2 weeks, please contact this office.      

## 2016-03-09 ENCOUNTER — Telehealth: Payer: Self-pay

## 2016-03-09 NOTE — Telephone Encounter (Signed)
Pt is needing a refill on her adderall   Best number [phone]

## 2016-03-12 ENCOUNTER — Other Ambulatory Visit: Payer: Self-pay

## 2016-03-12 DIAGNOSIS — F908 Attention-deficit hyperactivity disorder, other type: Secondary | ICD-10-CM

## 2016-03-12 DIAGNOSIS — F988 Other specified behavioral and emotional disorders with onset usually occurring in childhood and adolescence: Secondary | ICD-10-CM

## 2016-03-12 NOTE — Telephone Encounter (Signed)
Pt called in message needing her Adderall refilled

## 2016-03-16 NOTE — Telephone Encounter (Signed)
Please advise 

## 2016-03-16 NOTE — Telephone Encounter (Signed)
Pt calling about adderal refill put in initial request on 1/3

## 2016-03-17 MED ORDER — AMPHETAMINE-DEXTROAMPHETAMINE 20 MG PO TABS: 20.0000 mg | ORAL_TABLET | Freq: Two times a day (BID) | ORAL | 0 refills | Status: DC

## 2016-03-17 NOTE — Telephone Encounter (Signed)
Done

## 2016-03-31 NOTE — Telephone Encounter (Signed)
Up front 

## 2016-04-28 IMAGING — US US ABDOMEN COMPLETE
1 series · 14 of 25 positions shown · non-contrast
Comparison: None.

CLINICAL DATA: Abdominal and epigastric pain for 4 days. Abdominal
aortic bruit.

EXAM:
ULTRASOUND ABDOMEN COMPLETE

[Series 1: us abdomen complete · 0.16mm/px · 14 of 71 slices shown]
[im 1/71]
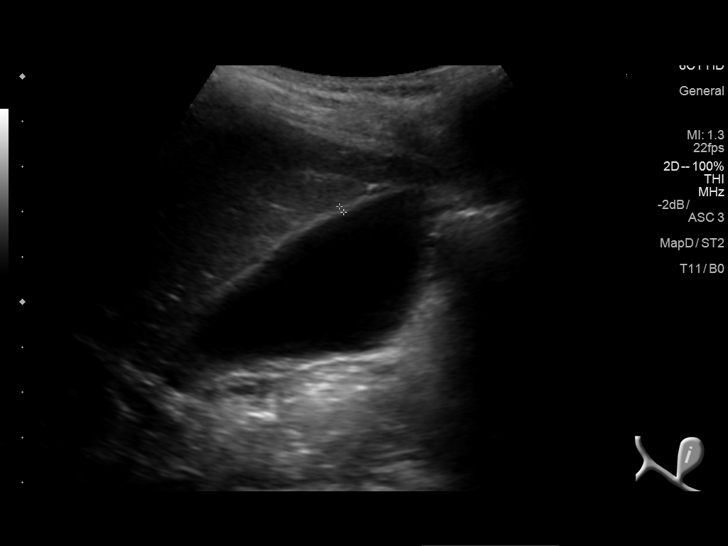
[im 6/71]
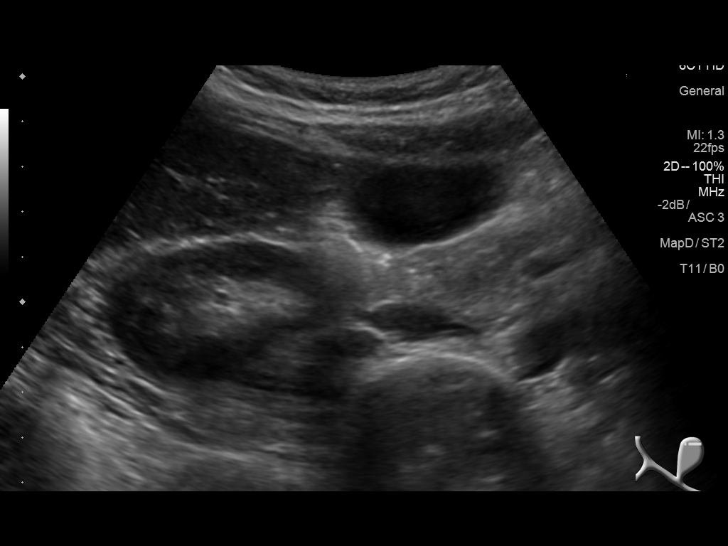
[im 12/71]
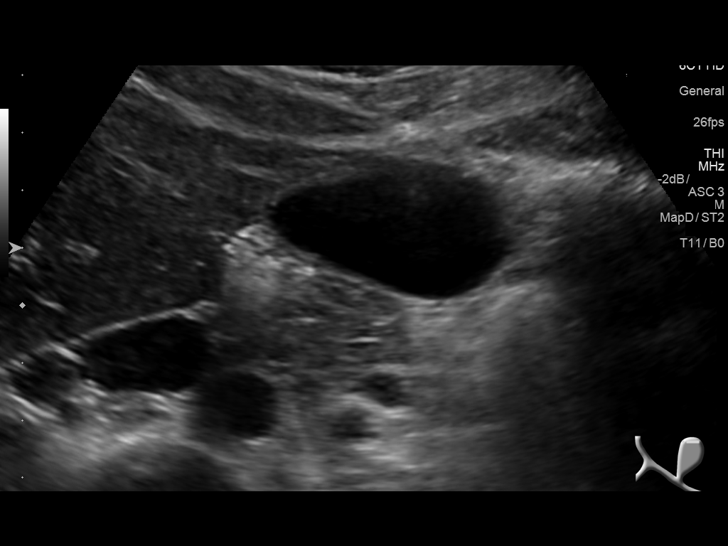
[im 18/71]
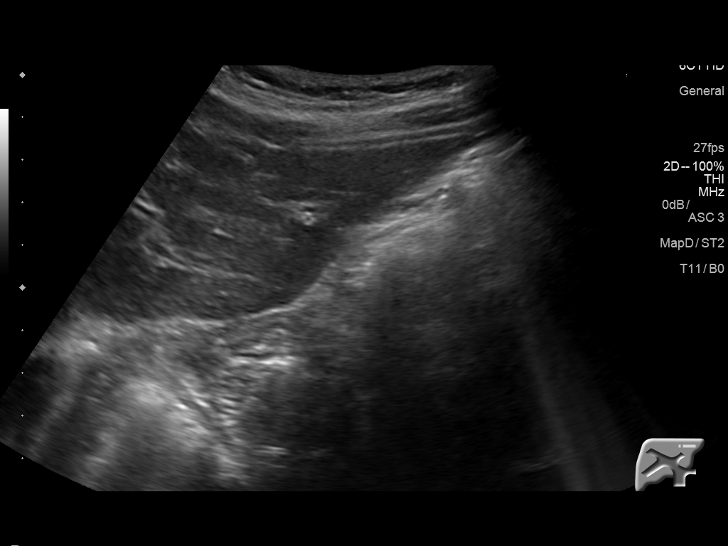
[im 24/71]
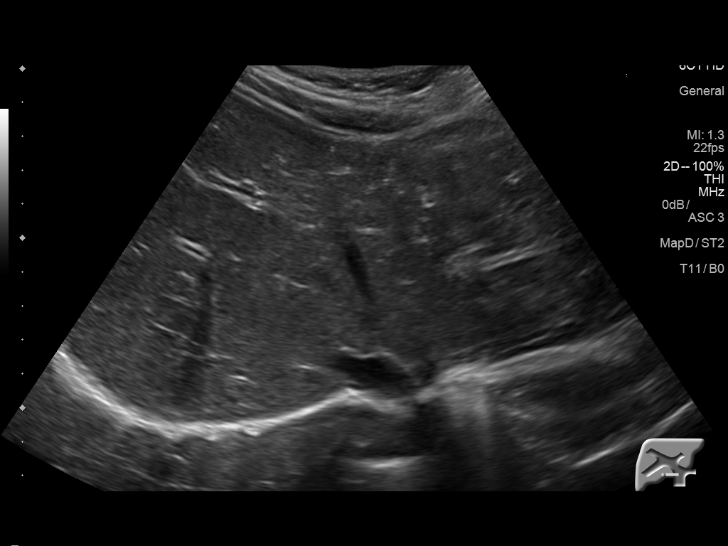
[im 27/71]
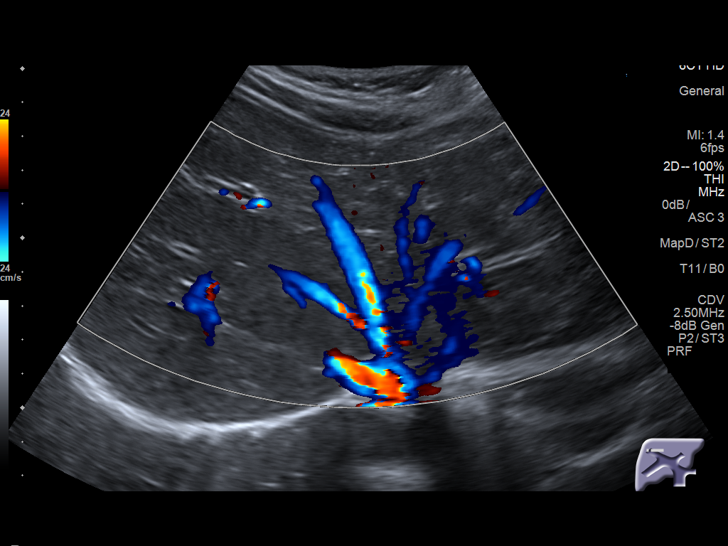
[im 33/71]
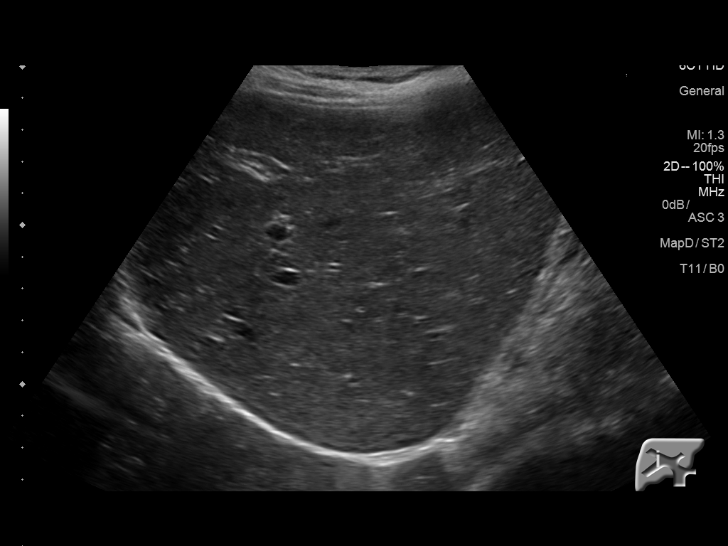
[im 38/71]
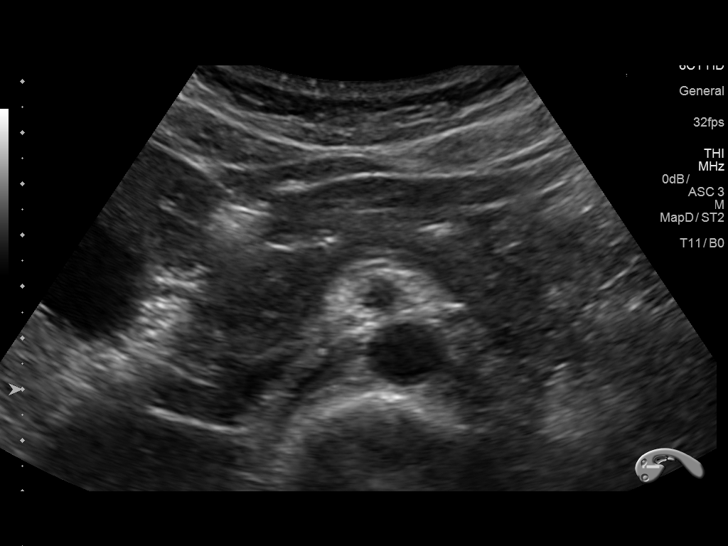
[im 44/71]
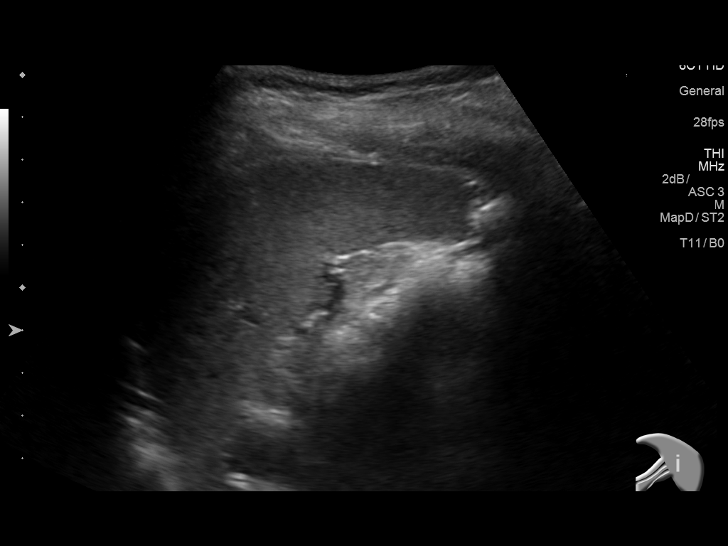
[im 47/71]
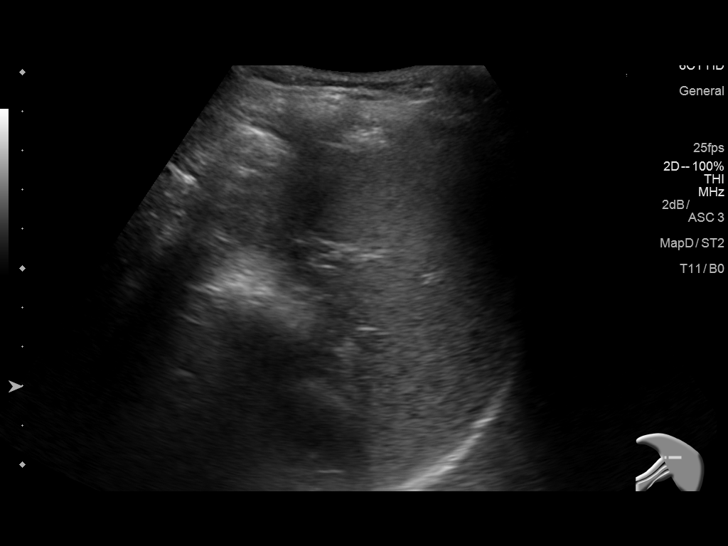
[im 53/71]
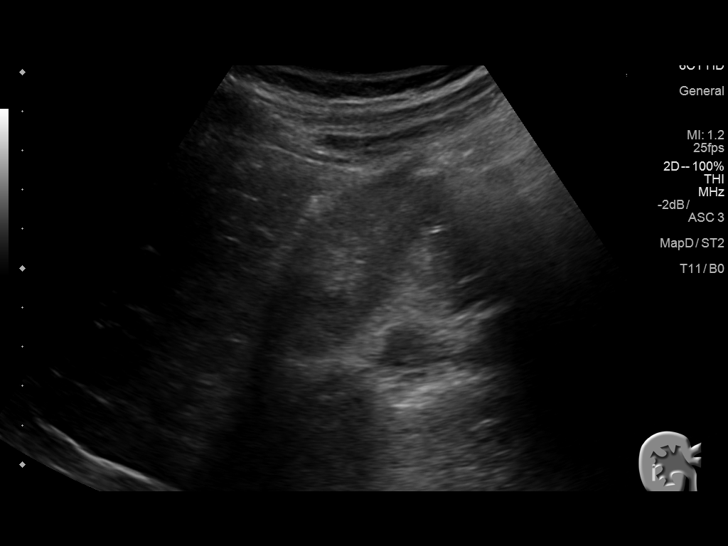
[im 59/71]
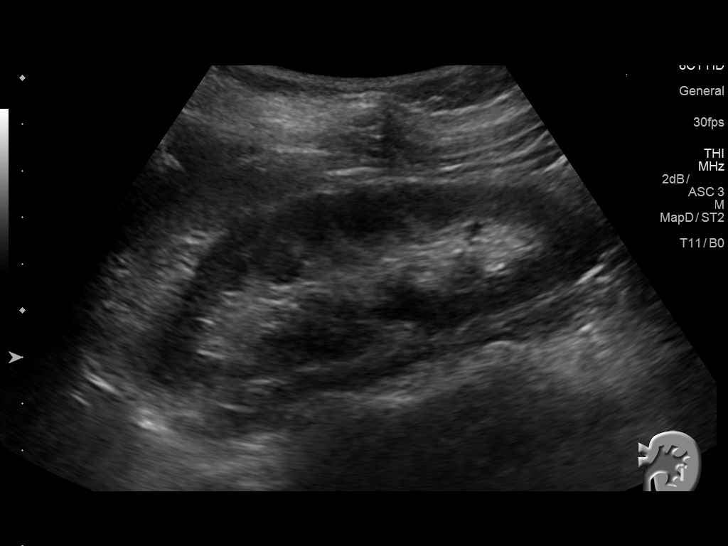
[im 65/71]
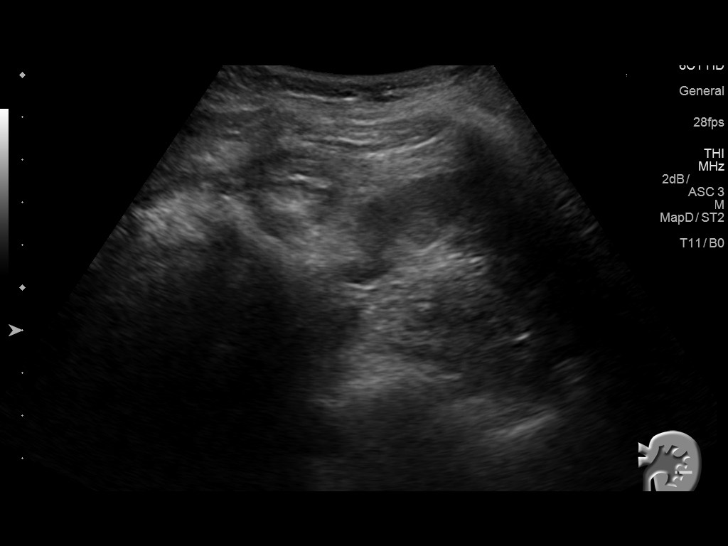
[im 71/71]
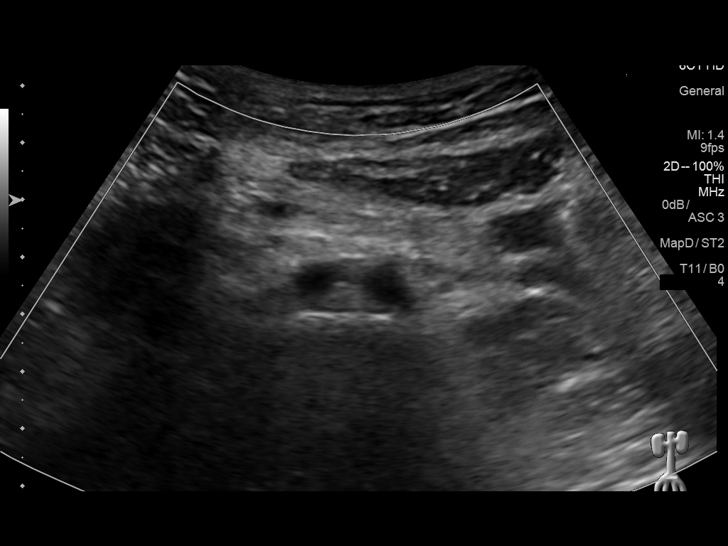

[14 of 25 positions shown; findings below may reference images not displayed]

FINDINGS: Gallbladder: No gallstones or wall thickening visualized. No
sonographic Murphy sign noted.

Common bile duct: Diameter: 0.1 cm

Liver: No focal lesion identified. The portal venules demonstrate
increased echogenicity compatible with a "starry sky" appearance.

IVC: No abnormality visualized.

Pancreas: Visualized portion unremarkable.

Spleen: Size and appearance within normal limits.

Right Kidney: Length: 9.7 cm. Echogenicity within normal limits. No
mass or hydronephrosis visualized.

Left Kidney: Length: 10.2 cm. Echogenicity within normal limits. No
mass or hydronephrosis visualized.

Abdominal aorta: No aneurysm visualized.

Other findings: None.
IMPRESSION: Starry sky appearance of the liver is nonspecific but can be seen in
hepatitis.

Negative for gallstones or evidence of cholecystitis.

## 2016-06-13 ENCOUNTER — Other Ambulatory Visit: Payer: Self-pay | Admitting: Family Medicine

## 2016-06-13 DIAGNOSIS — F988 Other specified behavioral and emotional disorders with onset usually occurring in childhood and adolescence: Secondary | ICD-10-CM

## 2016-06-13 NOTE — Telephone Encounter (Signed)
01/2017 last refills 11/2015 last ov

## 2016-06-13 NOTE — Telephone Encounter (Signed)
Ariel Peters Pt is calling for a refill on Adderall

## 2016-06-14 MED ORDER — AMPHETAMINE-DEXTROAMPHETAMINE 20 MG PO TABS: 20.0000 mg | ORAL_TABLET | Freq: Two times a day (BID) | ORAL | 0 refills | Status: DC

## 2016-06-14 NOTE — Telephone Encounter (Signed)
I have given her a month - she will need to make an appt with me prior to more refills.

## 2016-06-15 NOTE — Telephone Encounter (Signed)
Message script Adderall is ready for pick up

## 2016-07-14 ENCOUNTER — Telehealth: Payer: Self-pay | Admitting: Physician Assistant

## 2016-07-14 DIAGNOSIS — F908 Attention-deficit hyperactivity disorder, other type: Secondary | ICD-10-CM

## 2016-07-14 DIAGNOSIS — F988 Other specified behavioral and emotional disorders with onset usually occurring in childhood and adolescence: Secondary | ICD-10-CM

## 2016-07-14 NOTE — Telephone Encounter (Signed)
Patient needs her amphetamine-dextroamphetamine (ADDERALL) 20 MG tablet refilled, she does have an appointment with Sarah on 07/19/16.   Her call back number is [phone]

## 2016-07-14 NOTE — Telephone Encounter (Signed)
Patient wants a refill until appointment on 07/19/16. Please advise.

## 2016-07-16 MED ORDER — AMPHETAMINE-DEXTROAMPHETAMINE 20 MG PO TABS: 20.0000 mg | ORAL_TABLET | Freq: Two times a day (BID) | ORAL | 0 refills | Status: DC

## 2016-07-16 NOTE — Telephone Encounter (Signed)
Done and ready for patient.

## 2016-07-19 ENCOUNTER — Encounter: Payer: Self-pay | Admitting: Physician Assistant

## 2016-07-19 ENCOUNTER — Ambulatory Visit (INDEPENDENT_AMBULATORY_CARE_PROVIDER_SITE_OTHER): Payer: BLUE CROSS/BLUE SHIELD | Admitting: Physician Assistant

## 2016-07-19 DIAGNOSIS — F988 Other specified behavioral and emotional disorders with onset usually occurring in childhood and adolescence: Secondary | ICD-10-CM | POA: Diagnosis not present

## 2016-07-19 MED ORDER — AMPHETAMINE-DEXTROAMPHETAMINE 20 MG PO TABS: 20.0000 mg | ORAL_TABLET | Freq: Three times a day (TID) | ORAL | 0 refills | Status: DC

## 2016-07-19 NOTE — Patient Instructions (Signed)
     IF you received an x-ray today, you will receive an invoice from [facility]. Please contact [facility] at [phone] with questions or concerns regarding your invoice.   IF you received labwork today, you will receive an invoice from [facility]. Please contact [facility] at [phone] with questions or concerns regarding your invoice.   Our billing staff will not be able to assist you with questions regarding bills from these companies.  You will be contacted with the lab results as soon as they are available. The fastest way to get your results is to activate your My Chart account. Instructions are located on the last page of this paperwork. If you have not heard from us regarding the results in 2 weeks, please contact this office.     

## 2016-07-19 NOTE — Progress Notes (Signed)
Ariel Peters  MRN: [id number] DOB: [birth year]  PCP: Morrell Riddle, PA-C  Chief Complaint  Patient presents with  . Medication Refill    ADDERALL. Pt recieves Vyvanse from another presriber but states it is not helping    Subjective:  Pt presents to clinic for medication discussion.  Her mother wanted her to go to [facility] for treatment of her ADD and she did and her diagnosis was confirmed.  They started her on Vyvanse 20mg  and it did not help at all.  She does find that her Adderall 20mg  works well when it is working but it does not last long enough.  She takes her 1st dose about 10am and the 2nd dose at 2 which lasts about 4 hours but then she has until 6-10 at work that she has trouble focusing and concentrating on task at hand.  She is not sure where she wants to continue treatment - her mom is urging her to go to attention specialist but she is worried they will not keep her on the same medications as she knows works for her.  She currently has no trouble sleeping. Her depression is well controlled on the Celexa.  Review of Systems  Psychiatric/Behavioral: Positive for decreased concentration. Negative for sleep disturbance.    Patient Active Problem List   Diagnosis Date Noted  . ADD (attention deficit disorder) 08/31/2011  . Anxiety 08/31/2011    Current Outpatient Prescriptions on File Prior to Visit  Medication Sig Dispense Refill  . amphetamine-dextroamphetamine (ADDERALL) 20 MG tablet Take 1 tablet (20 mg total) by mouth 2 (two) times daily. For 60 d after signed 60 tablet 0  . citalopram (CELEXA) 20 MG tablet TAKE 1 TABLET (20 MG TOTAL) BY MOUTH DAILY. 90 tablet 3  . norgestimate-ethinyl estradiol (SPRINTEC 28) 0.25-35 MG-MCG tablet Take 1 tablet by mouth daily. 3 Package 3  . amphetamine-dextroamphetamine (ADDERALL) 20 MG tablet Take 1 tablet (20 mg total) by mouth 2 (two) times daily. For 30d after signed (Patient not taking: Reported on 07/19/2016) 60  tablet 0   No current facility-administered medications on file prior to visit.     No Known Allergies  Pt patients past, family and social history were reviewed and updated.   Objective:  BP 120/83   Pulse 81   Temp 98.1 F (36.7 C) (Oral)   Resp 18   Ht 5\' 5"  (1.651 m)   Wt 120 lb 9.6 oz (54.7 kg)   SpO2 98%   BMI 20.07 kg/m   Physical Exam  Constitutional: She is oriented to person, place, and time and well-developed, well-nourished, and in no distress.  HENT:  Head: Normocephalic and atraumatic.  Right Ear: Hearing and external ear normal.  Left Ear: Hearing and external ear normal.  Eyes: Conjunctivae are normal.  Neck: Normal range of motion.  Pulmonary/Chest: Effort normal.  Neurological: She is alert and oriented to person, place, and time. Gait normal.  Skin: Skin is warm and dry.  Psychiatric: Mood, memory, affect and judgment normal.  Vitals reviewed.   Wt Readings from Last 3 Encounters:  07/19/16 120 lb 9.6 oz (54.7 kg)  12/04/15 122 lb (55.3 kg)  09/07/15 121 lb (54.9 kg)    Assessment and Plan :  Attention deficit disorder (ADD) without hyperactivity - Plan: amphetamine-dextroamphetamine (ADDERALL) 20 MG tablet   D/w pt that likely the Vyvanse was not a high enough dose but she is comfortable with the Adderall but she needs longer coverage.  The dose she is on is currently working for the time frame that I would expect.  She does not have problems with the "roller coaster" of adderall immediate release.  Patient was given options and she would like to try the tid dosing of her current medication - we did talk about insurance coverage but her sister is on the same dosing and the insurance pays for her medication currently.   We did discuss that Mydayis would be an option for her or an increase dose of Vyvanse.  We discussed that she needs to pick a provider and f/u with them exclusively for her ADD.  She was given a month Rx and she will f/u in a month for a  recheck with me if she is going to stay with me or with [facility] if she is switching to them.  She agrees and understands the plan.  Benny LennertSarah Weber PA-C  Primary Care at [facility] 07/19/2016 12:27 PM

## 2016-08-18 ENCOUNTER — Telehealth: Payer: Self-pay | Admitting: Physician Assistant

## 2016-08-18 DIAGNOSIS — F988 Other specified behavioral and emotional disorders with onset usually occurring in childhood and adolescence: Secondary | ICD-10-CM

## 2016-08-18 NOTE — Telephone Encounter (Signed)
Pt calling needing a refill for Adderall 20 mg. Please advise.

## 2016-08-19 MED ORDER — AMPHETAMINE-DEXTROAMPHETAMINE 20 MG PO TABS: 20.0000 mg | ORAL_TABLET | Freq: Three times a day (TID) | ORAL | 0 refills | Status: DC

## 2016-08-19 NOTE — Telephone Encounter (Signed)
I have refilled for the patient. 

## 2016-08-19 NOTE — Telephone Encounter (Signed)
Please advise 

## 2016-09-13 ENCOUNTER — Ambulatory Visit: Payer: BLUE CROSS/BLUE SHIELD | Admitting: Physician Assistant

## 2016-09-16 ENCOUNTER — Other Ambulatory Visit: Payer: Self-pay

## 2016-09-16 MED ORDER — NORGESTIMATE-ETH ESTRADIOL 0.25-35 MG-MCG PO TABS: 1.0000 | ORAL_TABLET | Freq: Every day | ORAL | 0 refills | Status: DC

## 2016-09-22 ENCOUNTER — Telehealth: Payer: Self-pay | Admitting: Physician Assistant

## 2016-09-22 DIAGNOSIS — F988 Other specified behavioral and emotional disorders with onset usually occurring in childhood and adolescence: Secondary | ICD-10-CM

## 2016-09-22 MED ORDER — AMPHETAMINE-DEXTROAMPHETAMINE 20 MG PO TABS: 20.0000 mg | ORAL_TABLET | Freq: Three times a day (TID) | ORAL | 0 refills | Status: DC

## 2016-09-22 NOTE — Telephone Encounter (Signed)
Pt is requesting a med refill on her amphetamine-dextroamphetamine (ADDERALL) 20 MG tablet [[id number]][[id number]].  Contact number [phone]

## 2016-09-22 NOTE — Telephone Encounter (Signed)
I will give her a month supply since I am going out of town but she will need to see me within a month.

## 2016-09-22 NOTE — Telephone Encounter (Signed)
Please advise.....Marland Kitchen.in your last visit (in May) you said you wanted to see her in 4 weeks.

## 2016-09-29 ENCOUNTER — Other Ambulatory Visit: Payer: Self-pay

## 2016-09-29 MED ORDER — CITALOPRAM HYDROBROMIDE 20 MG PO TABS: ORAL_TABLET | ORAL | 0 refills | Status: DC

## 2016-10-22 ENCOUNTER — Telehealth: Payer: Self-pay

## 2016-10-22 ENCOUNTER — Telehealth: Payer: Self-pay | Admitting: Physician Assistant

## 2016-10-22 DIAGNOSIS — F908 Attention-deficit hyperactivity disorder, other type: Secondary | ICD-10-CM

## 2016-10-22 DIAGNOSIS — F988 Other specified behavioral and emotional disorders with onset usually occurring in childhood and adolescence: Secondary | ICD-10-CM

## 2016-10-22 NOTE — Telephone Encounter (Signed)
Please reorder. Last OV in May

## 2016-10-22 NOTE — Telephone Encounter (Signed)
Pt is calling to request a refill of her Adderall.  Please advise [phone]

## 2016-10-24 MED ORDER — AMPHETAMINE-DEXTROAMPHETAMINE 20 MG PO TABS: 20.0000 mg | ORAL_TABLET | Freq: Two times a day (BID) | ORAL | 0 refills | Status: DC

## 2016-10-24 MED ORDER — AMPHETAMINE-DEXTROAMPHETAMINE 20 MG PO TABS: 20.0000 mg | ORAL_TABLET | Freq: Three times a day (TID) | ORAL | 0 refills | Status: DC

## 2016-10-24 NOTE — Telephone Encounter (Signed)
Done - she will need OV in 3 months.

## 2016-10-24 NOTE — Telephone Encounter (Signed)
PT CALLING FOR REFILL ON ADDERRAL

## 2016-10-25 NOTE — Telephone Encounter (Signed)
Pt picked up rx

## 2016-11-06 ENCOUNTER — Other Ambulatory Visit: Payer: Self-pay | Admitting: Physician Assistant

## 2016-11-11 ENCOUNTER — Other Ambulatory Visit: Payer: Self-pay | Admitting: Physician Assistant
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
## 2016-12-05 ENCOUNTER — Other Ambulatory Visit: Payer: Self-pay | Admitting: Physician Assistant

## 2016-12-12 ENCOUNTER — Other Ambulatory Visit: Payer: Self-pay | Admitting: Physician Assistant

## 2016-12-12 NOTE — Telephone Encounter (Signed)
Patient is due for a follow up for birthcontrol.  What is her plan.  Is she still taking?

## 2016-12-13 NOTE — Telephone Encounter (Signed)
Please advise 

## 2016-12-19 ENCOUNTER — Other Ambulatory Visit: Payer: Self-pay

## 2016-12-19 ENCOUNTER — Telehealth: Payer: Self-pay | Admitting: Physician Assistant

## 2016-12-19 MED ORDER — CITALOPRAM HYDROBROMIDE 20 MG PO TABS: ORAL_TABLET | ORAL | 0 refills | Status: AC

## 2016-12-19 NOTE — Telephone Encounter (Signed)
Sent in a 15 day supply that will get pt to her apt.

## 2016-12-19 NOTE — Telephone Encounter (Signed)
PATIENT WOULD LIKE SARAH TO KNOW THAT SHE REALLY NEEDS TO GET A REFILL ON HER CITALOPRAM (CELEXA) 20 MG BECAUSE "THIS IS A VERY IMPORTANT MEDICINE." SHE WORKS A LOT AND IT'S HARD FOR HER TO COME IN. SHE DID MAKE AN APPOINTMENT TO SEE SARAH ON 12/27/16 BUT IN THE MEANTIME, SHE NEEDS ENOUGH TO MAKE IT UNTIL THEN. BEST PHONE [phone] (CELL) [facility] [address] MBC

## 2016-12-27 ENCOUNTER — Ambulatory Visit (INDEPENDENT_AMBULATORY_CARE_PROVIDER_SITE_OTHER): Payer: BLUE CROSS/BLUE SHIELD | Admitting: Physician Assistant

## 2016-12-27 ENCOUNTER — Encounter: Payer: Self-pay | Admitting: Physician Assistant

## 2016-12-27 VITALS — BP 110/82 | HR 104 | Temp 98.5°F | Resp 18 | Ht 66.0 in | Wt 119.0 lb

## 2016-12-27 DIAGNOSIS — Z3041 Encounter for surveillance of contraceptive pills: Secondary | ICD-10-CM | POA: Diagnosis not present

## 2016-12-27 DIAGNOSIS — F908 Attention-deficit hyperactivity disorder, other type: Secondary | ICD-10-CM | POA: Diagnosis not present

## 2016-12-27 DIAGNOSIS — F419 Anxiety disorder, unspecified: Secondary | ICD-10-CM | POA: Diagnosis not present

## 2016-12-27 DIAGNOSIS — F988 Other specified behavioral and emotional disorders with onset usually occurring in childhood and adolescence: Secondary | ICD-10-CM | POA: Diagnosis not present

## 2016-12-27 MED ORDER — NORGESTIMATE-ETH ESTRADIOL 0.25-35 MG-MCG PO TABS: 1.0000 | ORAL_TABLET | Freq: Every day | ORAL | 4 refills | Status: AC

## 2016-12-27 MED ORDER — AMPHETAMINE-DEXTROAMPHETAMINE 20 MG PO TABS: 20.0000 mg | ORAL_TABLET | Freq: Two times a day (BID) | ORAL | 0 refills | Status: DC

## 2016-12-27 MED ORDER — AMPHETAMINE-DEXTROAMPHETAMINE 20 MG PO TABS: 20.0000 mg | ORAL_TABLET | Freq: Three times a day (TID) | ORAL | 0 refills | Status: DC

## 2016-12-27 MED ORDER — ALPRAZOLAM 0.5 MG PO TABS: 0.5000 mg | ORAL_TABLET | Freq: Two times a day (BID) | ORAL | 0 refills | Status: DC | PRN

## 2016-12-27 NOTE — Patient Instructions (Signed)
     IF you received an x-ray today, you will receive an invoice from [facility]. Please contact [facility] at [phone] with questions or concerns regarding your invoice.   IF you received labwork today, you will receive an invoice from [facility]. Please contact [facility] at [phone] with questions or concerns regarding your invoice.   Our billing staff will not be able to assist you with questions regarding bills from these companies.  You will be contacted with the lab results as soon as they are available. The fastest way to get your results is to activate your My Chart account. Instructions are located on the last page of this paperwork. If you have not heard from us regarding the results in 2 weeks, please contact this office.     

## 2016-12-27 NOTE — Progress Notes (Signed)
Ariel Peters  MRN: [id number] DOB: 22-Sep-1990  PCP: Morrell Riddle, PA-C  Chief Complaint  Patient presents with  . Medication Refill    adderall, sprintec  . Anxiety    would like go back on xanax, having some social anxiety     Subjective:  Pt presents to clinic for medication refill and some breakthrough anxiety is stressful social situations.  The adderall is working well for her.  Her anxiety is essentially controlled except about 2-3x/ a month where she is in a social situation that causes increased anxiety - many times this happens at work - it comes on quick and typically when she gets anxious at the beginning it is hard for her to control it.  She has used xanax in the past and that has helped but she has not had any to try for these episodes. Just got a puppy and is in the middle of training him. Also needs a refill on her birth control.  History is obtained by patient.  Review of Systems  Cardiovascular: Positive for palpitations (with anxiety).  Psychiatric/Behavioral: Negative for sleep disturbance. The patient is nervous/anxious.     Patient Active Problem List   Diagnosis Date Noted  . ADD (attention deficit disorder) 08/31/2011  . Anxiety 08/31/2011    Current Outpatient Prescriptions on File Prior to Visit  Medication Sig Dispense Refill  . citalopram (CELEXA) 20 MG tablet Needs office visit for further refills 15 tablet 0   No current facility-administered medications on file prior to visit.     No Known Allergies  Past Medical History:  Diagnosis Date  . Adult ADHD   . Allergy   . Anxiety   . Depression   . Frequent headaches   . Migraines   . Scoliosis    Social History   Social History Narrative   Single, no children.   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 Smoking status: Current Every Day Smoker    Packs/day: 0.25    Years: 4.00    Types:  Cigarettes  . Smokeless tobacco: Never Used  . Alcohol use 0.0 oz/week     Comment: once a week   family history includes Hypertension in her father.     Objective:  BP 110/82   Pulse (!) 104   Temp 98.5 F (36.9 C) (Oral)   Resp 18   Ht 5\' 6"  (1.676 m)   Wt 119 lb (54 kg)   LMP 12/06/2016 (Approximate)   SpO2 99%   BMI 19.21 kg/m  Body mass index is 19.21 kg/m.  Physical Exam  Constitutional: She is oriented to person, place, and time and well-developed, well-nourished, and in no distress.  HENT:  Head: Normocephalic and atraumatic.  Right Ear: Hearing and external ear normal.  Left Ear: Hearing and external ear normal.  Eyes: Conjunctivae are normal.  Neck: Normal range of motion.  Cardiovascular: Normal rate, regular rhythm and normal heart sounds.   No murmur heard. Pulmonary/Chest: Effort normal and breath sounds normal. She has no wheezes.  Neurological: She is alert and oriented to person, place, and time. Gait normal.  Skin: Skin is warm and dry.  Psychiatric: Mood, memory, affect and judgment normal.  Vitals reviewed.   Assessment and Plan :  Attention deficit hyperactivity disorder (ADHD), other type - Plan: amphetamine-dextroamphetamine (ADDERALL) 20 MG tablet, amphetamine-dextroamphetamine (ADDERALL) 20 MG tablet,  amphetamine-dextroamphetamine (ADDERALL) 20 MG tablet - recheck in 3 months due to increase in social anxiety  Attention deficit disorder (ADD) without hyperactivity  Encounter for surveillance of contraceptive pills - Plan: norgestimate-ethinyl estradiol (SPRINTEC 28) 0.25-35 MG-MCG tablet - refill medications  Anxiety - Plan: ALPRAZolam (XANAX) 0.5 MG tablet - to be used sparingly - gave her #10 to last until her next appt in 3 months for recheck.  If she finds that she needs it more we will consider increase the celexa but at this time due to rare situation will will use rescue medicaations.  Benny LennertSarah Maryfrances Portugal PA-C  Primary Care at [facility] 12/30/2016 10:55 AM

## 2017-01-07 ENCOUNTER — Other Ambulatory Visit: Payer: Self-pay | Admitting: Physician Assistant

## 2017-01-07 NOTE — Telephone Encounter (Signed)
Please advise/ refill citalopram (CELEXA)   

## 2017-03-17 ENCOUNTER — Other Ambulatory Visit: Payer: Self-pay | Admitting: Physician Assistant

## 2017-03-17 DIAGNOSIS — F908 Attention-deficit hyperactivity disorder, other type: Secondary | ICD-10-CM

## 2017-03-17 NOTE — Telephone Encounter (Signed)
Copied from CRM [id number]. Topic: Quick Communication - See Telephone Encounter >> Mar 17, 2017  4:46 PM Terisa Starraylor, Brittany L wrote: CRM for notification. See Telephone encounter for:   03/17/17.   Pt needs a hand written script for her adderrall Call when ready for pick up [phone]

## 2017-03-20 ENCOUNTER — Telehealth: Payer: Self-pay

## 2017-03-20 DIAGNOSIS — F908 Attention-deficit hyperactivity disorder, other type: Secondary | ICD-10-CM

## 2017-03-20 MED ORDER — AMPHETAMINE-DEXTROAMPHETAMINE 20 MG PO TABS: 20.0000 mg | ORAL_TABLET | Freq: Three times a day (TID) | ORAL | 0 refills | Status: DC

## 2017-03-20 NOTE — Telephone Encounter (Signed)
Pt requesting refill.  Now out of Adderall.

## 2017-03-20 NOTE — Telephone Encounter (Signed)
Patient checking status, please advise °

## 2017-03-20 NOTE — Telephone Encounter (Signed)
Patient is out of meds, requesting this be done today.

## 2017-03-20 NOTE — Telephone Encounter (Signed)
Refill sent in. Pt should make office visit with Benny LennertSarah Weber per her last office visit note

## 2017-03-21 ENCOUNTER — Other Ambulatory Visit: Payer: Self-pay | Admitting: *Deleted

## 2017-03-21 ENCOUNTER — Telehealth: Payer: Self-pay | Admitting: *Deleted

## 2017-03-21 ENCOUNTER — Other Ambulatory Visit: Payer: Self-pay | Admitting: Physician Assistant

## 2017-03-21 DIAGNOSIS — F908 Attention-deficit hyperactivity disorder, other type: Secondary | ICD-10-CM

## 2017-03-21 NOTE — Telephone Encounter (Signed)
[facility]   I spoke with patient and informed her that she can go pick it up this afternoon.   Patient voiced understanding

## 2017-03-21 NOTE — Telephone Encounter (Signed)
Ariel Peters patient [facility]      Patient states all [facility] are [facility] Adderall .    Prescription is pended

## 2017-03-21 NOTE — Telephone Encounter (Signed)
Copied from CRM [phone]. Topic: General - Other >> Mar 21, 2017  9:41 AM Cecelia ByarsGreen, Temeka L, RMA wrote: Reason for CRM: patient is requesting prescription for adderall that was [facility] due to it is unavailable, so pt is [facility] that [facility] [place] where it is available

## 2017-03-21 NOTE — Telephone Encounter (Signed)
Pt got a month yesterday and was requested to make an appt before further refills.

## 2017-03-21 NOTE — Telephone Encounter (Signed)
Dr Creta LevinStallings gave her a Rx last night.  She needs a recheck before more are given.  She may have given it to her father.

## 2017-03-21 NOTE — Addendum Note (Signed)
Addended by: Johnney OuFRANCISCO,  L on: 03/21/2017 10:07 AM   Modules accepted: Orders

## 2017-03-21 NOTE — Telephone Encounter (Signed)
Ariel Peters,  I [facility] pended it.  [facility] per patient.

## 2017-03-21 NOTE — Telephone Encounter (Signed)
Pt - called sher is needing adderr[facility] to [facility] [place]

## 2017-03-24 ENCOUNTER — Ambulatory Visit: Payer: BLUE CROSS/BLUE SHIELD | Admitting: Physician Assistant

## 2017-03-24 ENCOUNTER — Encounter: Payer: Self-pay | Admitting: Physician Assistant

## 2017-03-24 ENCOUNTER — Other Ambulatory Visit: Payer: Self-pay

## 2017-03-24 DIAGNOSIS — F908 Attention-deficit hyperactivity disorder, other type: Secondary | ICD-10-CM | POA: Diagnosis not present

## 2017-03-24 DIAGNOSIS — F419 Anxiety disorder, unspecified: Secondary | ICD-10-CM

## 2017-03-24 MED ORDER — AMPHETAMINE-DEXTROAMPHETAMINE 20 MG PO TABS: 20.0000 mg | ORAL_TABLET | Freq: Two times a day (BID) | ORAL | 0 refills | Status: DC

## 2017-03-24 MED ORDER — ALPRAZOLAM 0.5 MG PO TABS: 0.5000 mg | ORAL_TABLET | Freq: Two times a day (BID) | ORAL | 0 refills | Status: DC | PRN

## 2017-03-24 NOTE — Patient Instructions (Addendum)
Please download the APP called mychart - then use the text to activate this APP - this will allow you to look at your labs and contact me as well as make appointments to see me in the future.     IF you received an x-ray today, you will receive an invoice from [facility]. Please contact [facility] at [phone] with questions or concerns regarding your invoice.   IF you received labwork today, you will receive an invoice from [facility]. Please contact [facility] at [phone] with questions or concerns regarding your invoice.   Our billing staff will not be able to assist you with questions regarding bills from these companies.  You will be contacted with the lab results as soon as they are available. The fastest way to get your results is to activate your My Chart account. Instructions are located on the last page of this paperwork. If you have not heard from us regarding the results in 2 weeks, please contact this office.     

## 2017-03-24 NOTE — Progress Notes (Signed)
Ariel Peters  MRN: [id number] DOB: 03/30/1990  PCP: Morrell RiddleWeber, Sarah L, PA-C  Chief Complaint  Patient presents with  . Medication Refill    just a med check     Subjective:  Pt presents to clinic for medication refill.  She has been doing good.  Working a lot due to the holidays.  She would like a refill of her xanax - she has used #10 pills since 10.2018 - rare use due to extreme anxiety.  Overall her anxiety is well controlled on her celexa.  History is obtained by patient.  Review of Systems  Constitutional: Negative for appetite change, chills, fever and unexpected weight change.  Cardiovascular: Negative for chest pain and palpitations.  Psychiatric/Behavioral: Negative for sleep disturbance.    Patient Active Problem List   Diagnosis Date Noted  . ADD (attention deficit disorder) 08/31/2011  . Anxiety 08/31/2011    Current Outpatient Medications on File Prior to Visit  Medication Sig Dispense Refill  . amphetamine-dextroamphetamine (ADDERALL) 20 MG tablet Take 1 tablet (20 mg total) by mouth 3 (three) times daily. Needs office visit 90 tablet 0  . citalopram (CELEXA) 20 MG tablet Needs office visit for further refills 15 tablet 0  . norgestimate-ethinyl estradiol (SPRINTEC 28) 0.25-35 MG-MCG tablet Take 1 tablet by mouth daily. 84 tablet 4  . citalopram (CELEXA) 20 MG tablet Take 1 tablet (20 mg total) daily by mouth. (Patient not taking: Reported on 03/24/2017) 90 tablet 2   No current facility-administered medications on file prior to visit.     No Known Allergies  Past Medical History:  Diagnosis Date  . Adult ADHD   . Allergy   . Anxiety   . Depression   . Frequent headaches   . Migraines   . Scoliosis    Social History   Social History Narrative   Single, no children.   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  daily   Social History   Tobacco Use  . Smoking status: Current Every Day Smoker    Packs/day: 0.25    Years: 4.00    Pack years: 1.00    Types: Cigarettes  . Smokeless tobacco: Never Used  Substance Use Topics  . Alcohol use: Yes    Alcohol/week: 0.0 oz    Comment: once a week  . Drug use: Yes    Types: Marijuana    Comment: once a week   family history includes Hypertension in her father.     Objective:  BP 110/72   Pulse 100   Temp 98.2 F (36.8 C) (Oral)   Resp 18   Ht 5\' 6"  (1.676 m)   Wt 115 lb 9.6 oz (52.4 kg)   LMP 03/24/2017 Comment: cureently on   SpO2 100%   BMI 18.66 kg/m  Body mass index is 18.66 kg/m.  Physical Exam  Constitutional: She is oriented to person, place, and time and well-developed, well-nourished, and in no distress.  HENT:  Head: Normocephalic and atraumatic.  Right Ear: Hearing and external ear normal.  Left Ear: Hearing and external ear normal.  Eyes: Conjunctivae are normal.  Neck: Normal range of motion.  Pulmonary/Chest: Effort normal.  Neurological: She is alert and oriented to person, place, and time. Gait normal.  Skin: Skin is warm and dry.  Psychiatric: Mood, memory, affect and judgment normal.  Vitals reviewed.   Assessment and Plan :  Attention deficit hyperactivity disorder (ADHD), other type -  Plan: amphetamine-dextroamphetamine (ADDERALL) 20 MG tablet, amphetamine-dextroamphetamine (ADDERALL) 20 MG tablet - pt just got a month Rx - 2 more months will be sent in and she will contact me when she needs her next 3 month supply  Anxiety - Plan: ALPRAZolam (XANAX) 0.5 MG tablet - rare use - she knows to only use with extreme anxiety.  Benny Lennert PA-C  Primary Care at [facility] 03/24/2017 12:30 PM

## 2017-03-29 ENCOUNTER — Ambulatory Visit: Payer: BLUE CROSS/BLUE SHIELD | Admitting: Physician Assistant

## 2017-04-27 ENCOUNTER — Encounter: Payer: BC Managed Care – PPO | Admitting: Family Medicine

## 2017-05-03 ENCOUNTER — Encounter: Payer: Self-pay | Admitting: Family Medicine

## 2017-05-17 ENCOUNTER — Telehealth: Payer: Self-pay | Admitting: Physician Assistant

## 2017-05-17 NOTE — Telephone Encounter (Signed)
Copied from CRM [id number]. Topic: Quick Communication - See Telephone Encounter >> May 17, 2017  4:09 PM Windy KalataMichael, Diera Wirkkala L, NT wrote: CRM for notification. See Telephone encounter for:  05/17/17.  Patient is calling and requesting a refill on amphetamine-dextroamphetamine (ADDERALL) 20 MG tablet. Please advise.   [id number] [place] - [address]  [address] [place] [id number]  Phone: [phone] Fax: [phone]

## 2017-05-18 NOTE — Telephone Encounter (Signed)
Adderall LOV: 03/24/17 PCP: Benny LennertSarah Peters [facility] [address] [place]

## 2017-05-19 NOTE — Telephone Encounter (Signed)
Spoke  With  [facility]  Pharmacist  At  [facility]  [address] pt picked  The  Rx up today  At 305  PM

## 2017-05-19 NOTE — Telephone Encounter (Signed)
She should have 1 at the pharmacy - I wrote it in January to be filled today.

## 2017-05-19 NOTE — Telephone Encounter (Signed)
Please advise 

## 2017-06-13 ENCOUNTER — Other Ambulatory Visit: Payer: Self-pay | Admitting: Physician Assistant

## 2017-06-13 ENCOUNTER — Other Ambulatory Visit: Payer: Self-pay | Admitting: Family Medicine

## 2017-06-13 DIAGNOSIS — F419 Anxiety disorder, unspecified: Secondary | ICD-10-CM

## 2017-06-13 DIAGNOSIS — F908 Attention-deficit hyperactivity disorder, other type: Secondary | ICD-10-CM

## 2017-06-13 NOTE — Telephone Encounter (Signed)
Patient is requesting a refill of the following medications: Requested Prescriptions   Pending Prescriptions Disp Refills  . ALPRAZolam (XANAX) 0.5 MG tablet 10 tablet 0    Sig: Take 1 tablet (0.5 mg total) by mouth 2 (two) times daily as needed for anxiety.    Date of patient request: 06/13/2017 Last office visit: 03/24/2017 Date of last refill: 03/24/2017 Last refill amount: 10 Follow up time period per chart: follow up in 6 months for ADD

## 2017-06-14 ENCOUNTER — Other Ambulatory Visit: Payer: Self-pay | Admitting: Family Medicine

## 2017-06-14 DIAGNOSIS — F908 Attention-deficit hyperactivity disorder, other type: Secondary | ICD-10-CM

## 2017-06-14 MED ORDER — ALPRAZOLAM 0.5 MG PO TABS: 0.5000 mg | ORAL_TABLET | Freq: Two times a day (BID) | ORAL | 0 refills | Status: DC | PRN

## 2017-06-14 NOTE — Telephone Encounter (Signed)
Done

## 2017-06-16 MED ORDER — AMPHETAMINE-DEXTROAMPHETAMINE 20 MG PO TABS: 20.0000 mg | ORAL_TABLET | Freq: Two times a day (BID) | ORAL | 0 refills | Status: DC

## 2017-07-11 ENCOUNTER — Other Ambulatory Visit: Payer: Self-pay | Admitting: Physician Assistant

## 2017-07-11 DIAGNOSIS — F908 Attention-deficit hyperactivity disorder, other type: Secondary | ICD-10-CM

## 2017-07-11 NOTE — Telephone Encounter (Signed)
Copied from CRM [id number]. Topic: Quick Communication - Rx Refill/Question >> Jul 11, 2017  4:40 PM Mcneil, Ja-Kwan wrote: Medication: amphetamine-dextroamphetamine (ADDERALL) 20 MG tablet   Preferred Pharmacy (with phone number or street name): [id number] [place] [place] - [address] [place] [address] Phone: [phone] Fax: [phone]  Agent: Please be advised that RX refills may take up to 3 business days. We ask that you follow-up with your pharmacy.

## 2017-07-12 NOTE — Telephone Encounter (Signed)
Refill of adderall  LOV 03/24/17  S. Weber  [facility] 06/16/17  #60  0 refills  [id number] [place], [place] [facility] [address]

## 2017-08-14 ENCOUNTER — Telehealth: Payer: Self-pay | Admitting: Physician Assistant

## 2017-08-14 NOTE — Telephone Encounter (Signed)
Left message to contact pharmacy for refill of requested medication. Medication can be filled on 08/16/17.

## 2017-08-14 NOTE — Telephone Encounter (Signed)
Copied from CRM [id number]. Topic: Quick Communication - Rx Refill/Question >> Aug 14, 2017  1:56 PM Oneal GroutSebastian, Jennifer S wrote: Medication: amphetamine-dextroamphetamine (ADDERALL) 20 MG tablet  Has the patient contacted their pharmacy? Yes.   (Agent: If no, request that the patient contact the pharmacy for the refill.) (Agent: If yes, when and what did the pharmacy advise?)  [facility]erred Pharm[facility] (with phone [facility] [address] [facility] [place]   Agent: Please be advised that RX refills may take up to 3 business days. We ask that you follow-up with your pharmacy.

## 2017-09-13 ENCOUNTER — Other Ambulatory Visit: Payer: Self-pay | Admitting: Physician Assistant

## 2017-09-13 ENCOUNTER — Telehealth: Payer: Self-pay | Admitting: Physician Assistant

## 2017-09-13 DIAGNOSIS — F908 Attention-deficit hyperactivity disorder, other type: Secondary | ICD-10-CM

## 2017-09-13 NOTE — Telephone Encounter (Unsigned)
Copied from CRM [phone] [phone]. Topic: Quick Communication - Rx Refill/Question >> Sep 13, 2017  3:07 PM Oneal GroutSebastian, Jennifer S wrote: Medication: amphetamine-dextroamphetamine (ADDERALL) 20 MG tablet Has the patient contacted their pharmacy? Yes.   (Agent: If no, request that the patient contact the pharmacy for the refill.) (Agent: If yes, when and what did the pharmacy advise?)  [facility] [address]  Agent: Please be advised that RX refills may take up to 3 business days. We ask that you follow-up with your pharmacy. >> Sep 13, 2017  5:29 PM Floria RavelingStovall, Shana A wrote: Pt called in again about refill. Pt upset, she was upset that this med was not filled the same day.  Reminded her of the turn around time.  She refused to accept that.  Pt was not polite in the conversation and will continue to call until it is done.

## 2017-09-13 NOTE — Telephone Encounter (Signed)
Adderall refill Last Refill:08/16/17  #60 Last OV: 03/24/17 PCP: Lilia ArgueSarah Weber,PA [facility] [address]

## 2017-09-13 NOTE — Telephone Encounter (Signed)
Copied from [id number]. Topic: Quick Communication - Rx Refill/Question >> Sep 13, 2017  3:07 PM Synthia Innocent wrote: Medication: amphetamine-dextroamphetamine (ADDERALL) 20 MG tablet Has the patient contacted their pharmacy? Yes.   (Agent: If no, request that the patient contact the pharmacy for the refill.) (Agent: If yes, when and what did the pharmacy advise?)  [facility] [address]  Agent: Please be advised that RX refills may take up to 3 business days. We ask that you follow-up with your pharmacy.

## 2017-09-13 NOTE — Telephone Encounter (Signed)
Pt called again and she is out and needing medication. She said always has problems every month getting prescriptions.   Asking if can get refills on prescription.

## 2017-09-13 NOTE — Telephone Encounter (Signed)
Patient is requesting a refill of the following medications: Requested Prescriptions   Pending Prescriptions Disp Refills  . amphetamine-dextroamphetamine (ADDERALL) 20 MG tablet 60 tablet 0    Sig: Take 1 tablet (20 mg total) by mouth 2 (two) times daily.    Date of patient request: 09/13/2017 Last office visit: 03/24/2017 Date of last refill: 08/16/2017 Last refill amount: 60 Follow up time period per chart:

## 2017-09-14 ENCOUNTER — Other Ambulatory Visit: Payer: Self-pay

## 2017-09-14 ENCOUNTER — Encounter: Payer: Self-pay | Admitting: Physician Assistant

## 2017-09-14 DIAGNOSIS — F908 Attention-deficit hyperactivity disorder, other type: Secondary | ICD-10-CM

## 2017-09-14 MED ORDER — AMPHETAMINE-DEXTROAMPHETAMINE 20 MG PO TABS: 20.0000 mg | ORAL_TABLET | Freq: Two times a day (BID) | ORAL | 0 refills | Status: DC

## 2017-09-14 NOTE — Telephone Encounter (Signed)
I will give her a month due to her having an appt 7/23.

## 2017-09-14 NOTE — Telephone Encounter (Signed)
Copied from CRM [phone]. Topic: Quick Communication - Rx Refill/Question >> Sep 13, 2017  3:07 PM Oneal GroutSebastian, Jennifer S wrote: Medication: amphetamine-dextroamphetamine (ADDERALL) 20 MG tablet Has the patient contacted their pharmacy? Yes.   (Agent: If no, request that the patient contact the pharmacy for the refill.) (Agent: If yes, when and what did the pharmacy advise?)  [facility] [address]  Agent: Please be advised that RX refills may take up to 3 business days. We ask that you follow-up with your pharmacy. >> Sep 13, 2017  5:29 PM Floria RavelingStovall, Shana A wrote: Pt called in again about refill. Pt upset, she was upset that this med was not filled the same day.  Reminded her of the turn around time.  She refused to accept that.  Pt was not polite in the conversation and will continue to call until it is done.  Message sent to Maralyn SagoSarah

## 2017-09-14 NOTE — Telephone Encounter (Signed)
Please advise 

## 2017-09-26 ENCOUNTER — Ambulatory Visit: Payer: BLUE CROSS/BLUE SHIELD | Admitting: Physician Assistant

## 2017-09-28 ENCOUNTER — Ambulatory Visit (INDEPENDENT_AMBULATORY_CARE_PROVIDER_SITE_OTHER): Payer: BLUE CROSS/BLUE SHIELD | Admitting: Physician Assistant

## 2017-09-28 ENCOUNTER — Other Ambulatory Visit: Payer: Self-pay

## 2017-09-28 ENCOUNTER — Telehealth: Payer: Self-pay | Admitting: Physician Assistant

## 2017-09-28 ENCOUNTER — Encounter: Payer: Self-pay | Admitting: Physician Assistant

## 2017-09-28 VITALS — BP 126/70 | HR 111 | Temp 98.4°F | Resp 18 | Ht 66.0 in | Wt 113.8 lb

## 2017-09-28 DIAGNOSIS — F419 Anxiety disorder, unspecified: Secondary | ICD-10-CM

## 2017-09-28 DIAGNOSIS — F908 Attention-deficit hyperactivity disorder, other type: Secondary | ICD-10-CM

## 2017-09-28 MED ORDER — ALPRAZOLAM 0.5 MG PO TABS: 0.5000 mg | ORAL_TABLET | Freq: Two times a day (BID) | ORAL | 0 refills | Status: AC | PRN

## 2017-09-28 MED ORDER — AMPHETAMINE-DEXTROAMPHET ER 25 MG PO CP24
25.0000 mg | ORAL_CAPSULE | Freq: Two times a day (BID) | ORAL | 0 refills | Status: DC
Start: 2017-09-28 — End: 2017-10-27

## 2017-09-28 NOTE — Telephone Encounter (Signed)
Patient is just going to go to [address] and pick up, requesting to cancel this transfer

## 2017-09-28 NOTE — Progress Notes (Signed)
Ariel Peters  MRN: [id number] DOB: 04/24/1990  PCP: Morrell RiddleWeber, Addison Whidbee L, PA-C  Chief Complaint  Patient presents with  . Medication Refill    ADD check     Subjective:  Pt presents to clinic for ADD med check.  She really feels like her current adderall dose is not working.  She would like to discuss other options.  She works from 12 PM to 9:30 PM at her job.  She takes her first dose of immediate release Adderall at 1130 on her way to work and feels like she gets about a couple of hours of relief.  She takes her second dose at dinnertime which is about 6 PM.  She has several hours before this dose where she feels like she is having no help with her symptoms.  Her second dose of the day does not get her through the last part of her shift.  She gets home and she rarely does anything other than sits on the couch.  She would like to go home and do household chores and art but really does not have the motivation or concentration to do anything.  She did try Vyvanse at 20 mg and felt like it did not do anything but the dose was never titrated up.  She has never been on extended release education because she is concerned about sleep disturbance.  She feels like her anxiety is recently peaked but when specifically asked feels like it could be related to her uncontrolled ADD.  History is obtained by patient.  Review of Systems  Constitutional: Negative for appetite change, chills, fever and unexpected weight change.  Cardiovascular: Negative for chest pain and palpitations.  Psychiatric/Behavioral: Negative for sleep disturbance.    Patient Active Problem List   Diagnosis Date Noted  . ADD (attention deficit disorder) 08/31/2011  . Anxiety 08/31/2011    Current Outpatient Medications on File Prior to Visit  Medication Sig Dispense Refill  . citalopram (CELEXA) 20 MG tablet Needs office visit for further refills 15 tablet 0  . citalopram (CELEXA) 20 MG tablet Take 1 tablet (20 mg total) daily  by mouth. 90 tablet 2  . norgestimate-ethinyl estradiol (SPRINTEC 28) 0.25-35 MG-MCG tablet Take 1 tablet by mouth daily. 84 tablet 4   No current facility-administered medications on file prior to visit.     No Known Allergies  Past Medical History:  Diagnosis Date  . Adult ADHD   . Allergy   . Anxiety   . Depression   . Frequent headaches   . Migraines   . Scoliosis    Social History   Social History Narrative   Single, no children.   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  daily   Social History   Tobacco Use  . Smoking status: Current Every Day Smoker    Packs/day: 0.25    Years: 4.00    Pack years: 1.00    Types: Cigarettes  . Smokeless tobacco: Never Used  Substance Use Topics  . Alcohol use: Yes    Alcohol/week: 0.0 oz    Comment: once a week  . Drug use: Yes    Types: Marijuana    Comment: once a week   family history includes Hypertension in her father.     Objective:  BP 126/70   Pulse (!) 111   Temp 98.4 F (36.9 C) (Oral)   Resp 18   Ht 5\' 6"  (1.676 m)  Wt 113 lb 12.8 oz (51.6 kg)   LMP 08/29/2017   SpO2 100%   BMI 18.37 kg/m  Body mass index is 18.37 kg/m.  Wt Readings from Last 3 Encounters:  09/28/17 113 lb 12.8 oz (51.6 kg)  03/24/17 115 lb 9.6 oz (52.4 kg)  12/27/16 119 lb (54 kg)    Physical Exam  Constitutional: She is oriented to person, place, and time. She appears well-developed and well-nourished.  HENT:  Head: Normocephalic and atraumatic.  Right Ear: Hearing and external ear normal.  Left Ear: Hearing and external ear normal.  Eyes: Conjunctivae are normal.  Neck: Normal range of motion.  Cardiovascular: Normal rate, regular rhythm and normal heart sounds.  No murmur heard. Pulmonary/Chest: Effort normal and breath sounds normal. She has no wheezes.  Neurological: She is alert and oriented to person, place, and time.  Skin: Skin is warm and dry.  Psychiatric: Judgment normal.    Vitals reviewed.   Assessment and Plan :  Attention deficit hyperactivity disorder (ADHD), other type - Plan: amphetamine-dextroamphetamine (ADDERALL XR) 25 MG 24 hr capsule -patient needs to be on on an extended release medication due to her greater than 8-hour shift at work and need for ADD control in her personal life.  Discussed different options with patient including Adderall XR which will likely need to be a twice daily dosing as well as a longer acting medicine such as Vyvanse or Mydayis.  Suspect the Vyvanse dose was just not high enough at 20 mg. Decided to start with Adderall XR with first dose being 30 minutes to an hour before her work shift and likely needing a second dose about 6 to 8 hours after the first.  Discussed with patient not to take less than 8 hours before bed due to increased risk of sleep disturbance.  She will contact me in about a week to 2 weeks with her status.  She will look at how long the Adderall lasts as well as does it do what needs to do during that time.  She will follow-up with me here in a month  Anxiety - Plan: ALPRAZolam (XANAX) 0.5 MG tablet -I suspect not controlled due to uncontrolled ADD.  Patient verbalized to me that they understand the following: diagnosis, what is being done for them, what to expect and what should be done at home.  Their questions have been answered.  See after visit summary for patient specific instructions.  Benny Lennert PA-C  Primary [facility] at [facility] 09/28/2017 9:26 AM  Please note: Portions of this report may have been transcribed using dragon voice recognition software. Every effort was made to ensure accuracy; however, inadvertent computerized transcription errors may be present.

## 2017-09-28 NOTE — Telephone Encounter (Signed)
Copied from CRM [id number]. Topic: Quick Communication - See Telephone Encounter >> Sep 28, 2017  9:31 AM Tamela OddiMartin, Don'[place]ash[place], NT wrote: CRM for notification. See Telephone encounter for: 09/28/17. Patient caelld and states that her Rx for amphetamine-dextroamphetamine (ADDERALL XR) 25 MG 24 [facility] is completely out and she needs it to be sent to another pharamcy . Please send a new RX to   [id number] [place], [place] - [address] [phone] (Phone) [phone] (Fax)

## 2017-09-28 NOTE — Telephone Encounter (Signed)
Pt states the pharmacy told her the extended release adderall is requiring PA. Pt is asking if this has been requested and wanting follow up. Please call [phone].

## 2017-09-28 NOTE — Telephone Encounter (Signed)
Please resend Adderall XR 25 mg 24 hr cap to different pharmacy:  [id number] [place], [place]  [id number] [address], [place]. Pharmacy is out of this med.   Benny LennertSarah Weber, PA

## 2017-09-28 NOTE — Patient Instructions (Addendum)
[facility] - [address], [place], [place] [address] Phone: [phone]     IF you received an x-ray today, you will receive an invoice from [facility]. Please contact [facility] at [phone] with questions or concerns regarding your invoice.   IF you received labwork today, you will receive an invoice from [facility]. Please contact [facility] at [phone] with questions or concerns regarding your invoice.   Our billing staff will not be able to assist you with questions regarding bills from these companies.  You will be contacted with the lab results as soon as they are available. The fastest way to get your results is to activate your My Chart account. Instructions are located on the last page of this paperwork. If you have not heard from us regarding the results in 2 weeks, please contact this office.     

## 2017-09-29 NOTE — Telephone Encounter (Signed)
[facility] and [facility]o GambleJody which states that the pt picked up her Adderall yesterday 09/28/2017

## 2017-10-06 ENCOUNTER — Other Ambulatory Visit: Payer: Self-pay | Admitting: Physician Assistant

## 2017-10-23 ENCOUNTER — Telehealth: Payer: Self-pay | Admitting: Physician Assistant

## 2017-10-23 NOTE — Telephone Encounter (Signed)
Copied from CRM [phone]. Topic: Quick Communication - Rx Refill/Question >> Oct 23, 2017  2:32 PM [name], Ariel S wrote: Medication: amphetamine-dextroamphetamine (ADDERALL XR) 25 MG 24 hr capsule - pt has a 4-5 days left   Pt is requesting call to notify her if the PA was approved that was requested last month. Last time she paid out of pocket for her adderall. Please call [phone].  Has the patient contacted their pharmacy? No - controlled Preferred Pharmacy (with phone number or street name): [id number]- [place], [place] - [address] [phone] (Phone) [phone] (Fax)

## 2017-10-27 ENCOUNTER — Other Ambulatory Visit: Payer: Self-pay | Admitting: Physician Assistant

## 2017-10-27 ENCOUNTER — Other Ambulatory Visit: Payer: Self-pay

## 2017-10-27 DIAGNOSIS — F908 Attention-deficit hyperactivity disorder, other type: Secondary | ICD-10-CM

## 2017-10-27 MED ORDER — CITALOPRAM HYDROBROMIDE 20 MG PO TABS: 20.0000 mg | ORAL_TABLET | Freq: Every day | ORAL | 1 refills | Status: AC

## 2017-10-27 NOTE — Telephone Encounter (Signed)
Pt called for status of RX. She called originally 8/19. Pt took last dose today. Please advise pt of RX.  [id number] [place],  - [address]    [phone] (Phone) [phone] (Fax)

## 2017-10-27 NOTE — Telephone Encounter (Signed)
Citalopram refill req; LRF 10/06/17; # 30; no refills Pharmacy requesting to consider 90 day supply PCP: S. Weber LOV: 09/28/17 [facility]. [facility] [address]; [place]  Refilled 90 day with one refill per protocol

## 2017-10-27 NOTE — Telephone Encounter (Signed)
Patient is requesting a refill of the following medications: Requested Prescriptions   Pending Prescriptions Disp Refills  . amphetamine-dextroamphetamine (ADDERALL XR) 25 MG 24 hr capsule 60 capsule 0    Sig: Take 1 capsule by mouth 2 (two) times daily.    Date of patient request: 10/27/2017 Last office visit: 09/28/2017 Date of last refill: 09/28/2017 Last refill amount: 60 Follow up time period per chart:

## 2017-10-27 NOTE — Telephone Encounter (Signed)
Please advise.Marland Kitchen.Marland Kitchen.Marland Kitchen.I sent the rx refill to you

## 2017-10-28 NOTE — Telephone Encounter (Signed)
Pt called to check the status of her refill.  I notified her that the request is in [place]' inbox and she will get to it as soon as she can.  She did become frustrated with this.  Please advise

## 2017-10-30 MED ORDER — AMPHETAMINE-DEXTROAMPHET ER 25 MG PO CP24: 25.0000 mg | ORAL_CAPSULE | Freq: Two times a day (BID) | ORAL | 0 refills | Status: DC

## 2017-10-30 NOTE — Telephone Encounter (Signed)
Done

## 2017-11-02 ENCOUNTER — Ambulatory Visit: Payer: BLUE CROSS/BLUE SHIELD | Admitting: Physician Assistant

## 2017-11-26 ENCOUNTER — Other Ambulatory Visit: Payer: Self-pay | Admitting: Physician Assistant

## 2017-11-26 DIAGNOSIS — F908 Attention-deficit hyperactivity disorder, other type: Secondary | ICD-10-CM

## 2017-11-27 MED ORDER — AMPHETAMINE-DEXTROAMPHET ER 25 MG PO CP24: 25.0000 mg | ORAL_CAPSULE | Freq: Two times a day (BID) | ORAL | 0 refills | Status: DC

## 2017-12-24 ENCOUNTER — Other Ambulatory Visit: Payer: Self-pay | Admitting: Family Medicine

## 2017-12-24 DIAGNOSIS — F908 Attention-deficit hyperactivity disorder, other type: Secondary | ICD-10-CM

## 2017-12-24 NOTE — Telephone Encounter (Signed)
Refill req sent to Dr. Creta Levin Re: Adderall XR refill

## 2018-01-21 ENCOUNTER — Other Ambulatory Visit: Payer: Self-pay | Admitting: Family Medicine

## 2018-01-21 DIAGNOSIS — F908 Attention-deficit hyperactivity disorder, other type: Secondary | ICD-10-CM

## 2018-03-23 ENCOUNTER — Other Ambulatory Visit: Payer: Self-pay | Admitting: Family Medicine

## 2018-03-23 DIAGNOSIS — F908 Attention-deficit hyperactivity disorder, other type: Secondary | ICD-10-CM

## 2018-03-26 ENCOUNTER — Other Ambulatory Visit: Payer: Self-pay | Admitting: Family Medicine

## 2018-03-26 DIAGNOSIS — F908 Attention-deficit hyperactivity disorder, other type: Secondary | ICD-10-CM

## 2018-03-26 NOTE — Telephone Encounter (Signed)
Pt needs to establish care with new provider.  Patient is requesting a refill of the following medications: Requested Prescriptions   Pending Prescriptions Disp Refills  . amphetamine-dextroamphetamine (ADDERALL XR) 25 MG 24 hr capsule 60 capsule 0    Sig: Take 1 capsule by mouth 2 (two) times daily.

## 2018-03-28 NOTE — Telephone Encounter (Signed)
Patient is requesting a refill of the following medications: Requested Prescriptions   Pending Prescriptions Disp Refills  . amphetamine-dextroamphetamine (ADDERALL XR) 25 MG 24 hr capsule 60 capsule 0    Sig: Take 1 capsule by mouth 2 (two) times daily.    Date of patient request: 03/26/2018 Last office visit: 03/24/17 Date of last refill:11/27/17 Last refill amount: 60 Follow up time period per chart: 09/21/2017

## 2019-04-15 ENCOUNTER — Other Ambulatory Visit: Payer: Self-pay | Admitting: Family Medicine

## 2019-04-15 DIAGNOSIS — F908 Attention-deficit hyperactivity disorder, other type: Secondary | ICD-10-CM

## 2019-04-15 NOTE — Telephone Encounter (Signed)
Patient need to be seen before we are able to refill meds. Patient have not been seen in a Year and provider  is no longer here

## 2019-04-16 MED ORDER — AMPHETAMINE-DEXTROAMPHET ER 25 MG PO CP24: 25.0000 mg | ORAL_CAPSULE | Freq: Two times a day (BID) | ORAL | 0 refills | Status: AC

## 2019-04-16 NOTE — Telephone Encounter (Signed)
Pt has scheduled appointment and would like to request a refill just enough to get her to the appointment

## 2019-04-30 ENCOUNTER — Encounter: Payer: BLUE CROSS/BLUE SHIELD | Admitting: Adult Health Nurse Practitioner
